# Patient Record
Sex: Female | Born: 1965 | Race: White | Hispanic: No | Smoking: Never smoker
Health system: Southern US, Community
[De-identification: ages and names within clinical notes are randomized; demographics above are authoritative.]

## PROBLEM LIST (undated history)

## (undated) DIAGNOSIS — I10 Essential (primary) hypertension: Secondary | ICD-10-CM

## (undated) DIAGNOSIS — E785 Hyperlipidemia, unspecified: Secondary | ICD-10-CM

## (undated) DIAGNOSIS — K9041 Non-celiac gluten sensitivity: Secondary | ICD-10-CM

## (undated) DIAGNOSIS — U071 COVID-19: Secondary | ICD-10-CM

## (undated) DIAGNOSIS — F419 Anxiety disorder, unspecified: Secondary | ICD-10-CM

## (undated) HISTORY — PX: OTHER SURGICAL HISTORY: SHX169

## (undated) HISTORY — DX: Essential (primary) hypertension: I10

## (undated) HISTORY — DX: COVID-19: U07.1

## (undated) HISTORY — PX: ADENOIDECTOMY W/ MYRINGOTOMY: SHX1128

## (undated) HISTORY — PX: ENDOMETRIAL ABLATION: SHX621

## (undated) HISTORY — PX: WRIST SURGERY: SHX841

## (undated) HISTORY — DX: Non-celiac gluten sensitivity: K90.41

---

## 1995-02-23 HISTORY — PX: TUBAL LIGATION: SHX77

## 2004-02-23 HISTORY — PX: WRIST SURGERY: SHX841

## 2004-10-21 ENCOUNTER — Ambulatory Visit: Payer: Self-pay | Admitting: Unknown Physician Specialty

## 2013-11-01 DIAGNOSIS — E785 Hyperlipidemia, unspecified: Secondary | ICD-10-CM | POA: Insufficient documentation

## 2013-11-01 DIAGNOSIS — R030 Elevated blood-pressure reading, without diagnosis of hypertension: Secondary | ICD-10-CM | POA: Insufficient documentation

## 2013-11-01 DIAGNOSIS — R739 Hyperglycemia, unspecified: Secondary | ICD-10-CM | POA: Insufficient documentation

## 2015-02-25 DIAGNOSIS — F32A Depression, unspecified: Secondary | ICD-10-CM | POA: Insufficient documentation

## 2015-02-25 DIAGNOSIS — F329 Major depressive disorder, single episode, unspecified: Secondary | ICD-10-CM | POA: Insufficient documentation

## 2015-02-25 DIAGNOSIS — F419 Anxiety disorder, unspecified: Secondary | ICD-10-CM

## 2015-12-25 ENCOUNTER — Emergency Department
Admission: EM | Admit: 2015-12-25 | Discharge: 2015-12-25 | Disposition: A | Discharge status: Home / Self Care | Payer: Managed Care, Other (non HMO) | Attending: Emergency Medicine | Admitting: Emergency Medicine

## 2015-12-25 ENCOUNTER — Emergency Department: Payer: Managed Care, Other (non HMO)

## 2015-12-25 ENCOUNTER — Encounter: Payer: Self-pay | Admitting: Emergency Medicine

## 2015-12-25 DIAGNOSIS — W1809XA Striking against other object with subsequent fall, initial encounter: Secondary | ICD-10-CM | POA: Diagnosis not present

## 2015-12-25 DIAGNOSIS — Z23 Encounter for immunization: Secondary | ICD-10-CM | POA: Diagnosis not present

## 2015-12-25 DIAGNOSIS — S0990XA Unspecified injury of head, initial encounter: Secondary | ICD-10-CM

## 2015-12-25 DIAGNOSIS — S0181XA Laceration without foreign body of other part of head, initial encounter: Secondary | ICD-10-CM

## 2015-12-25 DIAGNOSIS — Y9302 Activity, running: Secondary | ICD-10-CM | POA: Diagnosis not present

## 2015-12-25 DIAGNOSIS — S61212A Laceration without foreign body of right middle finger without damage to nail, initial encounter: Secondary | ICD-10-CM | POA: Diagnosis not present

## 2015-12-25 DIAGNOSIS — Y999 Unspecified external cause status: Secondary | ICD-10-CM | POA: Insufficient documentation

## 2015-12-25 DIAGNOSIS — Y929 Unspecified place or not applicable: Secondary | ICD-10-CM | POA: Diagnosis not present

## 2015-12-25 DIAGNOSIS — S80211A Abrasion, right knee, initial encounter: Secondary | ICD-10-CM | POA: Insufficient documentation

## 2015-12-25 MED ORDER — IBUPROFEN 800 MG PO TABS: 800.0000 mg | ORAL_TABLET | Freq: Three times a day (TID) | ORAL | 0 refills | Status: DC | PRN

## 2015-12-25 MED ORDER — IBUPROFEN 800 MG PO TABS
800.0000 mg | ORAL_TABLET | Freq: Once | ORAL | Status: AC
  Administered 2015-12-25: 800 mg via ORAL
  Filled 2015-12-25: qty 1

## 2015-12-25 MED ORDER — LIDOCAINE-EPINEPHRINE (PF) 1 %-1:200000 IJ SOLN
30.0000 mL | Freq: Once | INTRAMUSCULAR | Status: AC
  Administered 2015-12-25: 5 mL

## 2015-12-25 MED ORDER — CEPHALEXIN 500 MG PO CAPS: 500.0000 mg | ORAL_CAPSULE | Freq: Three times a day (TID) | ORAL | 0 refills | Status: AC

## 2015-12-25 MED ORDER — CEPHALEXIN 500 MG PO CAPS
ORAL_CAPSULE | ORAL | Status: AC
  Administered 2015-12-25: 500 mg via ORAL
  Filled 2015-12-25: qty 1

## 2015-12-25 MED ORDER — CEPHALEXIN 500 MG PO CAPS
500.0000 mg | ORAL_CAPSULE | Freq: Once | ORAL | Status: AC
  Administered 2015-12-25: 500 mg via ORAL

## 2015-12-25 MED ORDER — OXYCODONE-ACETAMINOPHEN 5-325 MG PO TABS: 1.0000 | ORAL_TABLET | Freq: Four times a day (QID) | ORAL | 0 refills | Status: DC | PRN

## 2015-12-25 MED ORDER — TETANUS-DIPHTH-ACELL PERTUSSIS 5-2.5-18.5 LF-MCG/0.5 IM SUSP
INTRAMUSCULAR | Status: AC
  Administered 2015-12-25: 0.5 mL via INTRAMUSCULAR
  Filled 2015-12-25: qty 0.5

## 2015-12-25 MED ORDER — LIDOCAINE-EPINEPHRINE (PF) 1 %-1:200000 IJ SOLN
INTRAMUSCULAR | Status: AC
  Administered 2015-12-25: 5 mL
  Filled 2015-12-25: qty 30

## 2015-12-25 MED ORDER — TETANUS-DIPHTH-ACELL PERTUSSIS 5-2.5-18.5 LF-MCG/0.5 IM SUSP
0.5000 mL | Freq: Once | INTRAMUSCULAR | Status: AC
  Administered 2015-12-25: 0.5 mL via INTRAMUSCULAR

## 2015-12-25 NOTE — ED Notes (Signed)
Pt returned from CT via stretcher.

## 2015-12-25 NOTE — ED Provider Notes (Signed)
[name] Emergency Department Provider Note  ____________________________________________  Time seen: Approximately 9:56 PM  I have reviewed the triage vital signs and the nursing notes.   HISTORY  Chief Complaint Laceration    HPI Anna Andersen is a 50 y.o. female who tripped while jogging after her dog and fell forward hitting her head on a concrete object. She believes she hit a corner base of a concrete post.She denies loss of consciousness. She has a headache and some dizziness. She has a laceration to the mid forehead as well as the right volar hand third digit. She has abrasions to her right anterior knee. She denies chest pain or shortness of breath. She denies injury to her left hand or arm. No back pain. Only minimal neck pain. No dental injury.   No past medical history on file.  There are no active problems to display for this patient.   No past surgical history on file.    Allergies Review of patient's allergies indicates no known allergies.  No family history on file.  Social History Social History  Substance Use Topics  . Smoking status: Not on file  . Smokeless tobacco: Not on file  . Alcohol use Not on file    Review of Systems Constitutional: No fever/chills Eyes: No visual changes. ENT: No sore throat. Cardiovascular: Denies chest pain. Respiratory: Denies shortness of breath. Gastrointestinal: No abdominal pain.  No nausea, no vomiting. Musculoskeletal: Negative for back pain. Skin: per HPI Neurological: Negative for headaches, focal weakness or numbness. 10-point ROS otherwise negative.  ____________________________________________   PHYSICAL EXAM:  VITAL SIGNS: ED Triage Vitals  Enc Vitals Group     BP 12/25/15 2040 102/71     Pulse Rate 12/25/15 2040 70     Resp 12/25/15 2040 18     Temp 12/25/15 2040 97.8 F (36.6 C)     Temp Source 12/25/15 2040 Oral     SpO2 12/25/15 2040 100 %     Weight  12/25/15 2041 135 lb (61.2 kg)     Height 12/25/15 2041 5\' 1"  (1.549 m)     Head Circumference --      Peak Flow --      Pain Score 12/25/15 2041 8     Pain Loc --      Pain Edu? --      Excl. in [place]? --     Constitutional: Alert and oriented. Well appearing and in no acute distress. Eyes: Conjunctivae are normal. PERRL. EOMI. Ears:  Clear with normal landmarks. No erythema. Head:Linear laceration to the mid forehead about 3 cm: Another laceration superiorly approximately 1 cm in length. Nose: No congestion/rhinnorhea. Mouth/Throat: Mucous membranes are moist.  Oropharynx non-erythematous. No lesions. Neck:  Supple.  No adenopathy.  PARAcervical spine tenderness to palpation. Cardiovascular: Normal rate, regular rhythm. Grossly normal heart sounds.  Good peripheral circulation. Respiratory: Normal respiratory effort.  No retractions. Lungs CTAB. Gastrointestinal: Soft and nontender. No distention. No abdominal bruits. No CVA tenderness. Musculoskeletal: Nml ROM of upper and lower extremity joints. Neurologic:  Normal speech and language. No gross focal neurologic deficits are appreciated. No gait instability. Skin:  Skin is warm, dry and intact. No rash noted. Except for a superficial flap laceration to the volar aspect of the right third digit 2      1 cm abrasions to the right anterior knee Psychiatric: Mood and affect are normal. Speech and behavior are normal.  ____________________________________________   LABS (all labs ordered are  listed, but only abnormal results are displayed)  Labs Reviewed - No data to display ____________________________________________  EKG   ____________________________________________  RADIOLOGY  CLINICAL DATA:  Tripped and hit corner of concrete post with head. Forehead laceration. Initial encounter.  EXAM: CT HEAD WITHOUT CONTRAST  TECHNIQUE: Contiguous axial images were obtained from the base of the skull through the vertex without  intravenous contrast.  COMPARISON:  None.  FINDINGS: Brain: No evidence of acute infarction, hemorrhage, hydrocephalus, extra-axial collection or mass lesion/mass effect.  The posterior fossa, including the cerebellum, brainstem and fourth ventricle, is within normal limits. The third and lateral ventricles, and basal ganglia are unremarkable in appearance. The cerebral hemispheres are symmetric in appearance, with normal gray-white differentiation. No mass effect or midline shift is seen.  Vascular: No hyperdense vessel or unexpected calcification.  Skull: There is no evidence of fracture; visualized osseous structures are unremarkable in appearance.  Sinuses/Orbits: The visualized portions of the orbits are within normal limits. The paranasal sinuses and mastoid air cells are well-aerated.  Other: Soft tissue swelling is noted overlying the left frontal calvarium, with associated soft tissue laceration. This extends to the nose.  IMPRESSION: 1. No evidence of traumatic intracranial injury or fracture. 2. Soft tissue swelling overlying the left frontal calvarium, with associated soft tissue laceration.   Electronically Signed   By: Garald Balding M.D.   On: 12/25/2015 21:58  ____________________________________________   PROCEDURES  Procedure(s) performed: LACERATION REPAIR Performed by: Mortimer Fries Authorized by: Mortimer Fries Consent: Verbal consent obtained. Risks and benefits: risks, benefits and alternatives were discussed Consent given by: patient Patient identity confirmed: provided demographic data Prepped and Draped in normal sterile fashion Wound explored  Laceration Location: Forehead  Laceration Length: 3 cm  No Foreign Bodies seen or palpated  Anesthesia: local infiltration  Local anesthetic: lidocaine 1 % with epinephrine  Anesthetic total: 7 ml  Irrigation method: syringe Amount of cleaning: standard  Skin closure: Nylon    Number of sutures: 8   Technique: Simple interrupted   Patient tolerance: Patient tolerated the procedure well with no immediate complications.    LACERATION REPAIR Performed by: Mortimer Fries Authorized by: Mortimer Fries Consent: Verbal consent obtained. Risks and benefits: risks, benefits and alternatives were discussed Consent given by: patient Patient identity confirmed: provided demographic data Prepped and Draped in normal sterile fashion Wound explored  Laceration Location: Superior forehead  Laceration Length: 1 cm  No Foreign Bodies seen or palpated  Anesthesia: NA  Local anesthetic: na  Anesthetic total: na  Irrigation method: syringe Amount of cleaning: standard  Skin closure: Steri-Strips   Number of sutures:   Technique:   Patient tolerance: Patient tolerated the procedure well with no immediate complications.   LACERATION REPAIR Performed by: Mortimer Fries Authorized by: Mortimer Fries Consent: Verbal consent obtained. Risks and benefits: risks, benefits and alternatives were discussed Consent given by: patient Patient identity confirmed: provided demographic data Prepped and Draped in normal sterile fashion Wound explored  Laceration Location: Right hand third digit volar aspect  Laceration Length: 1 cm  No Foreign Bodies seen or palpated  Anesthesia: N/A  Local anesthetic: NA  Anesthetic total:  Irrigation method: syringe Amount of cleaning: standard  Skin closure: Wrapped in Vaseline gauze   Number of sutures: N/A  Technique:   Patient tolerance: Patient tolerated the procedure well with no immediate complications.  Critical Care performed: No  ____________________________________________   INITIAL IMPRESSION / ASSESSMENT AND PLAN / ED COURSE  Pertinent labs & imaging results  that were available during my care of the patient were reviewed by me and considered in my medical decision making (see chart for  details).  50 year old who suffered a fall resulting in head trauma and right hand injury, as well as right knee abrasion. Normal CT of the head. Laceration care per procedures above. Tolerated well. Will follow-up in 7-10 days for suture removal. She is given tetanus injection as well as 4 days of antibiotics, Keflex. Instructed in wound care. She can return to emergency for any concerns. ____________________________________________   FINAL CLINICAL IMPRESSION(S) / ED DIAGNOSES  Final diagnoses:  Laceration of forehead, initial encounter  Laceration of right middle finger without foreign body without damage to nail, initial encounter  Injury of head, initial encounter      Mortimer Fries, PA-C 12/25/15 2327    Eula Listen, MD 12/25/15 2338

## 2015-12-25 NOTE — Discharge Instructions (Signed)
Keep forehead wound dry for 24 hours. Afterwards only slight exposure to water. Watch for signs of infection. Follow-up in 7-10 days for suture removal. Return to the emergency room for any concerns.

## 2015-12-25 NOTE — ED Notes (Signed)
Pt tripped, hit corner of a concrete post. Forehead lac, R knee abrasion, R middle finger swelling and bruising. Lac to underneath R middle finger. Chuck placed underneath. Bleeding controlled at this time. Denies LOC, denies blood thinner use. Alert and oriented x4.

## 2015-12-25 NOTE — ED Triage Notes (Signed)
Pt in with co laceration to forehead and bruising ro gith 3rd finger after tripping on dog and hitting cement post. Denies any loc, co soreness all over.

## 2015-12-30 ENCOUNTER — Ambulatory Visit: Payer: Self-pay | Admitting: Physician Assistant

## 2016-03-01 ENCOUNTER — Ambulatory Visit: Payer: Self-pay | Admitting: Physician Assistant

## 2016-03-01 VITALS — BP 131/89 | HR 93 | Temp 98.2°F

## 2016-03-01 DIAGNOSIS — J0100 Acute maxillary sinusitis, unspecified: Secondary | ICD-10-CM

## 2016-03-01 MED ORDER — AMOXICILLIN 875 MG PO TABS
875.0000 mg | ORAL_TABLET | Freq: Two times a day (BID) | ORAL | 0 refills | Status: DC
Start: 2016-03-01 — End: 2016-05-31

## 2016-03-01 NOTE — Progress Notes (Signed)
S: C/o sore throat and congestion for 1-2 days, no fever, chills, cp/sob, v/d; throat is sore like it is when she gets strep,  Using otc meds:   O: PE: vitals wnl nad, perrl eomi, normocephalic, tms dull, nasal mucosa red and swollen, throat injected, neck supple no lymph, lungs c t a, cv rrr, neuro intact  A:  Acute sinusitis   P: drink fluids, continue regular meds , use otc meds of choice, return if not improving in 5 days, return earlier if worsening , amoxil 875 bid x 10d

## 2016-05-31 ENCOUNTER — Ambulatory Visit: Payer: Self-pay | Admitting: Physician Assistant

## 2016-05-31 ENCOUNTER — Encounter: Payer: Self-pay | Admitting: Physician Assistant

## 2016-05-31 VITALS — BP 122/80 | HR 85 | Temp 98.3°F

## 2016-05-31 DIAGNOSIS — J0100 Acute maxillary sinusitis, unspecified: Secondary | ICD-10-CM

## 2016-05-31 DIAGNOSIS — J301 Allergic rhinitis due to pollen: Secondary | ICD-10-CM

## 2016-05-31 MED ORDER — FLUTICASONE PROPIONATE 50 MCG/ACT NA SUSP: 2.0000 | Freq: Every day | NASAL | 6 refills | Status: DC

## 2016-05-31 NOTE — Progress Notes (Addendum)
S: C/o runny nose and congestion for 3 days, no fever, chills, cp/sob, v/d; mucus is green and thick this am only, otherwise runny nose is clear, cough is sporadic, just really tired, has body aches but just started on lipitor, had a physical with her doctor in March, was put on amoxil for a sinus infection  Using otc meds:   O: PE: vitals wnl, nad, perrl eomi, normocephalic, tms dull, nasal mucosa red and swollen, throat injected, neck supple no lymph, lungs c t a, cv rrr, neuro intact  A:  Acute sinusitis, allergic rhinitis   P: drink fluids, continue regular meds , use otc meds of choice, return if not improving in 5 days, return earlier if worsening , flonase, otc claritin, otc co q 10 for aches assoc with lipitor, if worsenig this week will call in an antibiotic Pt called and is not better, ?whether she could get an antibiotic, sent rx for amoxil to [facility]

## 2016-06-02 MED ORDER — AMOXICILLIN 875 MG PO TABS
875.0000 mg | ORAL_TABLET | Freq: Two times a day (BID) | ORAL | 0 refills | Status: DC
Start: 2016-06-02 — End: 2017-09-26

## 2016-06-02 NOTE — Addendum Note (Signed)
Addended by: Versie Starks on: 06/02/2016 02:31 PM   Modules accepted: Orders

## 2016-10-27 ENCOUNTER — Other Ambulatory Visit: Payer: Self-pay

## 2016-10-27 DIAGNOSIS — Z299 Encounter for prophylactic measures, unspecified: Secondary | ICD-10-CM

## 2016-10-27 NOTE — Progress Notes (Signed)
Patient came in to have blood drawn for testing per Dr. Fulton Reek orders.

## 2016-10-28 LAB — MICROSCOPIC EXAMINATION: Casts: NONE SEEN /lpf

## 2016-10-28 LAB — CMP12+LP+TP+TSH+6AC+CBC/D/PLT
ALK PHOS: 43 IU/L (ref 39–117)
ALT: 16 IU/L (ref 0–32)
AST: 19 IU/L (ref 0–40)
Albumin/Globulin Ratio: 1.7 (ref 1.2–2.2)
Albumin: 4.5 g/dL (ref 3.5–5.5)
BASOS: 0 %
BUN / CREAT RATIO: 17 (ref 9–23)
BUN: 14 mg/dL (ref 6–24)
Basophils Absolute: 0 10*3/uL (ref 0.0–0.2)
Bilirubin Total: 0.2 mg/dL (ref 0.0–1.2)
CALCIUM: 9.6 mg/dL (ref 8.7–10.2)
CHOL/HDL RATIO: 4.8 ratio — AB (ref 0.0–4.4)
Chloride: 107 mmol/L — ABNORMAL HIGH (ref 96–106)
Cholesterol, Total: 240 mg/dL — ABNORMAL HIGH (ref 100–199)
Creatinine, Ser: 0.83 mg/dL (ref 0.57–1.00)
EOS (ABSOLUTE): 0.1 10*3/uL (ref 0.0–0.4)
EOS: 2 %
Estimated CHD Risk: 1.2 times avg. — ABNORMAL HIGH (ref 0.0–1.0)
Free Thyroxine Index: 2.4 (ref 1.2–4.9)
GFR calc non Af Amer: 83 mL/min/{1.73_m2} (ref >59)
GFR, EST AFRICAN AMERICAN: 96 mL/min/{1.73_m2} (ref >59)
GGT: 22 IU/L (ref 0–60)
Globulin, Total: 2.7 g/dL (ref 1.5–4.5)
Glucose: 98 mg/dL (ref 65–99)
HDL: 50 mg/dL (ref >39)
HEMATOCRIT: 39.1 % (ref 34.0–46.6)
Hemoglobin: 13.1 g/dL (ref 11.1–15.9)
IMMATURE GRANS (ABS): 0 10*3/uL (ref 0.0–0.1)
Immature Granulocytes: 0 %
Iron: 57 ug/dL (ref 27–159)
LDH: 158 IU/L (ref 119–226)
LDL CALC: 177 mg/dL — AB (ref 0–99)
LYMPHS: 39 %
Lymphocytes Absolute: 2.6 10*3/uL (ref 0.7–3.1)
MCH: 30 pg (ref 26.6–33.0)
MCHC: 33.5 g/dL (ref 31.5–35.7)
MCV: 90 fL (ref 79–97)
MONOS ABS: 0.5 10*3/uL (ref 0.1–0.9)
Monocytes: 8 %
NEUTROS ABS: 3.5 10*3/uL (ref 1.4–7.0)
Neutrophils: 51 %
POTASSIUM: 4.7 mmol/L (ref 3.5–5.2)
Phosphorus: 3.1 mg/dL (ref 2.5–4.5)
Platelets: 397 10*3/uL — ABNORMAL HIGH (ref 150–379)
RBC: 4.36 x10E6/uL (ref 3.77–5.28)
RDW: 12.9 % (ref 12.3–15.4)
SODIUM: 141 mmol/L (ref 134–144)
T3 Uptake Ratio: 29 % (ref 24–39)
T4, Total: 8.3 ug/dL (ref 4.5–12.0)
TRIGLYCERIDES: 66 mg/dL (ref 0–149)
TSH: 2.95 u[IU]/mL (ref 0.450–4.500)
Total Protein: 7.2 g/dL (ref 6.0–8.5)
Uric Acid: 3.8 mg/dL (ref 2.5–7.1)
VLDL Cholesterol Cal: 13 mg/dL (ref 5–40)
WBC: 6.7 10*3/uL (ref 3.4–10.8)

## 2016-10-28 LAB — URINALYSIS, ROUTINE W REFLEX MICROSCOPIC
BILIRUBIN UA: NEGATIVE
Glucose, UA: NEGATIVE
KETONES UA: NEGATIVE
Leukocytes, UA: NEGATIVE
NITRITE UA: NEGATIVE
Protein, UA: NEGATIVE
SPEC GRAV UA: 1.021 (ref 1.005–1.030)
Urobilinogen, Ur: 0.2 mg/dL (ref 0.2–1.0)
pH, UA: 5 (ref 5.0–7.5)

## 2016-10-28 LAB — HGB A1C W/O EAG: Hgb A1c MFr Bld: 5.4 % (ref 4.8–5.6)

## 2017-02-08 ENCOUNTER — Other Ambulatory Visit: Payer: Self-pay

## 2017-02-08 DIAGNOSIS — L509 Urticaria, unspecified: Secondary | ICD-10-CM

## 2017-02-08 NOTE — Progress Notes (Signed)
Patient came in to have blood drawn for testing per Dr. Dellis Filbert Spark's orders.

## 2017-02-11 LAB — ALLERGEN PROFILE, MINI-PANEL
Alternaria Alternata IgE: <0.1 kU/L
Bermuda Grass IgE: <0.1 kU/L
Elm, American IgE: <0.1 kU/L
Kentucky Bluegrass IgE: <0.1 kU/L
Oak, White IgE: <0.1 kU/L
Ragweed, Short IgE: <0.1 kU/L

## 2017-02-11 LAB — ALLERGEN PROFILE, BASIC FOOD
Chocolate/Cacao IgE: <0.1 kU/L
Egg, Whole IgE: <0.1 kU/L
Food Mix (Seafoods) IgE: NEGATIVE
Milk IgE: <0.1 kU/L

## 2017-02-11 LAB — ANTIGLIADIN IGG (NATIVE): ANTIGLIADIN IGG (NATIVE): 7 U (ref 0–19)

## 2017-02-11 LAB — GLUTEN SENSITIVITY SCREEN: tTG/DGP Screen: NEGATIVE

## 2017-02-11 LAB — NOTE:

## 2017-02-11 LAB — F004-IGE WHEAT

## 2017-02-22 HISTORY — PX: COLONOSCOPY: SHX174

## 2017-09-26 ENCOUNTER — Ambulatory Visit: Payer: Self-pay | Admitting: Family Medicine

## 2017-09-26 VITALS — BP 141/89 | HR 96 | Temp 98.3°F | Resp 20

## 2017-09-26 DIAGNOSIS — N3000 Acute cystitis without hematuria: Secondary | ICD-10-CM

## 2017-09-26 DIAGNOSIS — R300 Dysuria: Secondary | ICD-10-CM

## 2017-09-26 LAB — POCT URINALYSIS DIPSTICK
BILIRUBIN UA: NEGATIVE
GLUCOSE UA: NEGATIVE
KETONES UA: NEGATIVE
Nitrite, UA: NEGATIVE
PH UA: 6.5 (ref 5.0–8.0)
Protein, UA: NEGATIVE
SPEC GRAV UA: 1.01 (ref 1.010–1.025)
Urobilinogen, UA: 0.2 E.U./dL

## 2017-09-26 MED ORDER — SULFAMETHOXAZOLE-TRIMETHOPRIM 800-160 MG PO TABS: 1.0000 | ORAL_TABLET | Freq: Two times a day (BID) | ORAL | 0 refills | Status: AC

## 2017-09-26 NOTE — Progress Notes (Signed)
Subjective: Dysuria    Anna Andersen is a 52 y.o. female who complains of burning with urination, urinary frequency and suprapubic pressure for 1 day. Patient denies any other complaints at all, including flank pain, congestion, cough, fever, chills, malaise, headache, rhinitis, sorethroat, abdominal pain, nausea, vomiting, diarrhea, hematuria, concern for STI, or vaginal discharge.  Patient does not have a history of recurrent UTI.  Patient believes she has a history of pyelonephritis in the remote past over 2 years ago but reports only being treated with a 3-day course of Bactrim, with complete resolution of symptoms.  Denies sequela.  Denies any other relevant history.   Review of Systems Pertinent items noted in HPI and remainder of comprehensive ROS otherwise negative.    Objective:   General: alert, cooperative, appears stated age and no distress  Abdomen:  Mild suprapubic "pressure" upon palpation.  Otherwise abdomen is soft, non-tender, without masses or organomegaly   Back: CVA tenderness absent  GU: defer exam-unable to perform in clinic.   Laboratory:  Urine dipstick: negative for all components except a small amount of blood present and moderate leukocytes present.   Assessment:    Acute cystitis    Plan: Plan:   Medications: TMP/SMX.  Patient has taken this in the past and tolerated it well.  Discussed side/adverse effects. Maintain adequate hydration Follow up with PCP in 4 days and as needed. Red flag symptoms and indications to seek medical care discussed.

## 2019-07-12 ENCOUNTER — Other Ambulatory Visit: Payer: Self-pay | Admitting: Internal Medicine

## 2019-07-12 DIAGNOSIS — Z1231 Encounter for screening mammogram for malignant neoplasm of breast: Secondary | ICD-10-CM

## 2019-07-26 ENCOUNTER — Telehealth: Payer: Self-pay | Admitting: Certified Nurse Midwife

## 2019-07-26 NOTE — Telephone Encounter (Signed)
Pt called in and needs to move her appt. She stated that she having chest pains. The pt stated that she is going to the ED and that she is moving the appt, due to going there. The pt also stated she went to her PCP and he has her on blood pressure issues. I told the pt I was going to take a message and move her appt. The pt verbally understood.

## 2019-07-27 ENCOUNTER — Encounter: Payer: Self-pay | Admitting: Certified Nurse Midwife

## 2019-07-30 ENCOUNTER — Other Ambulatory Visit
Admission: RE | Admit: 2019-07-30 | Discharge: 2019-07-30 | Disposition: A | Discharge status: Home / Self Care | Payer: Managed Care, Other (non HMO) | Source: Ambulatory Visit | Attending: *Deleted | Admitting: *Deleted

## 2019-07-31 LAB — TROPONIN T: Troponin T (Highly Sensitive): <6 ng/L (ref 0–14)

## 2019-08-31 ENCOUNTER — Other Ambulatory Visit: Payer: Self-pay

## 2019-08-31 ENCOUNTER — Encounter: Payer: Self-pay | Admitting: Certified Nurse Midwife

## 2019-08-31 ENCOUNTER — Ambulatory Visit (INDEPENDENT_AMBULATORY_CARE_PROVIDER_SITE_OTHER): Payer: Managed Care, Other (non HMO) | Admitting: Certified Nurse Midwife

## 2019-08-31 VITALS — BP 127/90 | HR 74 | Ht 61.0 in | Wt 130.4 lb

## 2019-08-31 DIAGNOSIS — N921 Excessive and frequent menstruation with irregular cycle: Secondary | ICD-10-CM

## 2019-08-31 DIAGNOSIS — Z9889 Other specified postprocedural states: Secondary | ICD-10-CM | POA: Diagnosis not present

## 2019-08-31 DIAGNOSIS — Z8616 Personal history of COVID-19: Secondary | ICD-10-CM

## 2019-08-31 DIAGNOSIS — Z01419 Encounter for gynecological examination (general) (routine) without abnormal findings: Secondary | ICD-10-CM | POA: Diagnosis not present

## 2019-08-31 NOTE — Progress Notes (Signed)
ANNUAL PREVENTATIVE CARE GYN  ENCOUNTER NOTE  Subjective:       Anna Andersen is a 54 y.o. [id number] female here for a routine annual gynecologic exam.  Current complaints: 1. Irregular, heavy menses since February  Denies difficulty breathing or respiratory distress, chest pain, abdominal pain, dysuria, and leg pain or swelling.   History of ablation, COVID-19 (February 2020) and gluten allergy.   PCP: [facility]   Gynecologic History  Patient's last menstrual period was 07/16/2019 (exact date).  Contraception: none  Last Pap: 08/2016. Results were: Negative/Negative  Last mammogram: never, ordered.   Last colonoscopy: 2019. Results were: Negative  Obstetric History  OB History  Gravida Para Term Preterm AB Living  3 3 3     3   SAB TAB Ectopic Multiple Live Births          3    # Outcome Date GA Lbr Len/2nd Weight Sex Delivery Anes PTL Lv  3 Term 11/29/95   7 lb 15 oz (3.6 kg) M CS-Unspec  N LIV  2 Term 01/16/91   8 lb 7.5 oz (3.841 kg) M CS-Unspec  N LIV  1 Term 08/02/87   8 lb 7 oz (3.827 kg) F CS-Unspec  N LIV    Past Medical History:  Diagnosis Date   COVID-19    Hypertension     Current Outpatient Medications on File Prior to Visit  Medication Sig Dispense Refill   propranolol (INDERAL) 20 MG tablet Take 20 mg by mouth 2 (two) times daily.     RHODIOLA ROSEA PO Take by mouth.     No current facility-administered medications on file prior to visit.    Allergies  Allergen Reactions   Gluten Meal Itching and Rash    Social History   Socioeconomic History   Marital status: Divorced    Spouse name: Not on file   Number of children: Not on file   Years of education: Not on file   Highest education level: Not on file  Occupational History   Not on file  Tobacco Use   Smoking status: Never Smoker   Smokeless tobacco: Never Used  Substance and Sexual Activity   Alcohol use: Never   Drug use: Never   Sexual activity: Yes    Birth  control/protection: None  Other Topics Concern   Not on file  Social History Narrative   Not on file   Social Determinants of Health   Financial Resource Strain:    Difficulty of Paying Living Expenses:   Food Insecurity:    Worried About Charity fundraiser in the Last Year:    Arboriculturist in the Last Year:   Transportation Needs:    Film/video editor (Medical):    Lack of Transportation (Non-Medical):   Physical Activity:    Days of Exercise per Week:    Minutes of Exercise per Session:   Stress:    Feeling of Stress :   Social Connections:    Frequency of Communication with Friends and Family:    Frequency of Social Gatherings with Friends and Family:    Attends Religious Services:    Active Member of Clubs or Organizations:    Attends Archivist Meetings:    Marital Status:   Intimate Partner Violence:    Fear of Current or Ex-Partner:    Emotionally Abused:    Physically Abused:    Sexually Abused:     The following portions of the patient's history  were reviewed and updated as appropriate: allergies, current medications, past family history, past medical history, past social history, past surgical history and problem list.  Review of Systems  ROS negative except as noted above. Information obtained from patient.    Objective:   BP 127/90    Pulse 74    Ht 5\' 1"  (1.549 m)    Wt 130 lb 6 oz (59.1 kg)    LMP 07/16/2019 (Exact Date)    BMI 24.63 kg/m    CONSTITUTIONAL: Well-developed, well-nourished female in no acute distress.   PSYCHIATRIC: Normal mood and affect. Normal behavior. Normal judgment and thought content.  [place]: Alert and oriented to person, place, and time. Normal muscle tone coordination. No cranial nerve deficit noted.  HENT:  Normocephalic, atraumatic, External right and left ear normal.   EYES: Conjunctivae and EOM are normal. Pupils are equal and round.   NECK: Normal range of motion, supple, no  masses.  Normal thyroid.   SKIN: Skin is warm and dry. Diffuse gluten reaction rash present. Not diaphoretic. No erythema. No pallor.  CARDIOVASCULAR: Normal heart rate noted, regular rhythm, no murmur.  RESPIRATORY: Clear to auscultation bilaterally. Effort and breath sounds normal, no problems with respiration noted.  BREASTS: Symmetric in size. No masses, skin changes, nipple drainage, or lymphadenopathy.  ABDOMEN: Soft, normal bowel sounds, no distention noted.  No tenderness, rebound or guarding.   PELVIC:  External Genitalia: Normal  Vagina: Normal  Cervix: Normal  Uterus: Normal  Adnexa: Normal  RV: External Exam NormaI    MUSCULOSKELETAL: Normal range of motion. No tenderness.  No cyanosis, clubbing, or edema.  2+ distal pulses.  LYMPHATIC: No Axillary, Supraclavicular, or Inguinal Adenopathy.  Assessment:   Annual gynecologic examination 54 y.o.   Contraception: none   Normal BMI   Problem List Items Addressed This Visit    None    Visit Diagnoses    Well woman exam    -  Primary   Relevant Orders   FSH/LH   Progesterone   Estradiol   Menorrhagia with irregular cycle       Relevant Orders   FSH/LH   Progesterone   Estradiol   History of endometrial ablation       Relevant Orders   FSH/LH   Progesterone   Estradiol   History of 2019 novel coronavirus disease (COVID-19)          Plan:   Pap: Not needed   Mammogram: Ordered by [employer]: See orders  Routine preventative health maintenance measures emphasized: Exercise/Diet/Weight control, Tobacco Warnings, Alcohol/Substance use risks and Stress Management; see AVS  Reviewed red flag symptoms and when to call  Return to [place] for [employer] or sooner if needed   Dani Gobble, CNM  [facility], [facility] 08/31/19 10:29 AM

## 2019-08-31 NOTE — Patient Instructions (Signed)
Perimenopause  Perimenopause is the normal time of life before and after menstrual periods stop completely (menopause). Perimenopause can begin 2-8 years before menopause, and it usually lasts for 1 year after menopause. During perimenopause, the ovaries may or may not produce an egg. What are the causes? This condition is caused by a natural change in hormone levels that happens as you get older. What increases the risk? This condition is more likely to start at an earlier age if you have certain medical conditions or treatments, including:  A tumor of the pituitary gland in the brain.  A disease that affects the ovaries and hormone production.  Radiation treatment for cancer.  Certain cancer treatments, such as chemotherapy or hormone (anti-estrogen) therapy.  Heavy smoking and excessive alcohol use.  Family history of early menopause. What are the signs or symptoms? Perimenopausal changes affect each woman differently. Symptoms of this condition may include:  Hot flashes.  Night sweats.  Irregular menstrual periods.  Decreased sex drive.  Vaginal dryness.  Headaches.  Mood swings.  Depression.  Memory problems or trouble concentrating.  Irritability.  Tiredness.  Weight gain.  Anxiety.  Trouble getting pregnant. How is this diagnosed? This condition is diagnosed based on your medical history, a physical exam, your age, your menstrual history, and your symptoms. Hormone tests may also be done. How is this treated? In some cases, no treatment is needed. You and your health care provider should make a decision together about whether treatment is necessary. Treatment will be based on your individual condition and preferences. Various treatments are available, such as:  Menopausal hormone therapy (MHT).  Medicines to treat specific symptoms.  Acupuncture.  Vitamin or herbal supplements. Before starting treatment, make sure to let your health care provider  know if you have a personal or family history of:  Heart disease.  Breast cancer.  Blood clots.  Diabetes.  Osteoporosis. Follow these instructions at home: Lifestyle  Do not use any products that contain nicotine or tobacco, such as cigarettes and e-cigarettes. If you need help quitting, ask your health care provider.  Eat a balanced diet that includes fresh fruits and vegetables, whole grains, soybeans, eggs, lean meat, and low-fat dairy.  Get at least 30 minutes of physical activity on 5 or more days each week.  Avoid alcoholic and caffeinated beverages, as well as spicy foods. This may help prevent hot flashes.  Get 7-8 hours of sleep each night.  Dress in layers that can be removed to help you manage hot flashes.  Find ways to manage stress, such as deep breathing, meditation, or journaling. General instructions  Keep track of your menstrual periods, including: ? When they occur. ? How heavy they are and how long they last. ? How much time passes between periods.  Keep track of your symptoms, noting when they start, how often you have them, and how long they last.  Take over-the-counter and prescription medicines only as told by your health care provider.  Take vitamin supplements only as told by your health care provider. These may include calcium, vitamin E, and vitamin D.  Use vaginal lubricants or moisturizers to help with vaginal dryness and improve comfort during sex.  Talk with your health care provider before starting any herbal supplements.  Keep all follow-up visits as told by your health care provider. This is important. This includes any group therapy or counseling. Contact a health care provider if:  You have heavy vaginal bleeding or pass blood clots.  Your period  lasts more than 2 days longer than normal.  Your periods are recurring sooner than 21 days.  You bleed after having sex. Get help right away if:  You have chest pain, trouble  breathing, or trouble talking.  You have severe depression.  You have pain when you urinate.  You have severe headaches.  You have vision problems. Summary  Perimenopause is the time when a woman's body begins to move into menopause. This may happen naturally or as a result of other health problems or medical treatments.  Perimenopause can begin 2-8 years before menopause, and it usually lasts for 1 year after menopause.  Perimenopausal symptoms can be managed through medicines, lifestyle changes, and complementary therapies such as acupuncture. This information is not intended to replace advice given to you by your health care provider. Make sure you discuss any questions you have with your health care provider. Document Revised: 01/21/2017 Document Reviewed: 03/16/2016 Elsevier Patient Education  2020 Elsevier Inc.   Preventive Care 53-64 Years Old, Female Preventive care refers to visits with your health care provider and lifestyle choices that can promote health and wellness. This includes:  A yearly physical exam. This may also be called an annual well check.  Regular dental visits and eye exams.  Immunizations.  Screening for certain conditions.  Healthy lifestyle choices, such as eating a healthy diet, getting regular exercise, not using drugs or products that contain nicotine and tobacco, and limiting alcohol use. What can I expect for my preventive care visit? Physical exam Your health care provider will check your:  Height and weight. This may be used to calculate body mass index (BMI), which tells if you are at a healthy weight.  Heart rate and blood pressure.  Skin for abnormal spots. Counseling Your health care provider may ask you questions about your:  Alcohol, tobacco, and drug use.  Emotional well-being.  Home and relationship well-being.  Sexual activity.  Eating habits.  Work and work environment.  Method of birth control.  Menstrual  cycle.  Pregnancy history. What immunizations do I need?  Influenza (flu) vaccine  This is recommended every year. Tetanus, diphtheria, and pertussis (Tdap) vaccine  You may need a Td booster every 10 years. Varicella (chickenpox) vaccine  You may need this if you have not been vaccinated. Zoster (shingles) vaccine  You may need this after age 60. Measles, mumps, and rubella (MMR) vaccine  You may need at least one dose of MMR if you were born in 1957 or later. You may also need a second dose. Pneumococcal conjugate (PCV13) vaccine  You may need this if you have certain conditions and were not previously vaccinated. Pneumococcal polysaccharide (PPSV23) vaccine  You may need one or two doses if you smoke cigarettes or if you have certain conditions. Meningococcal conjugate (MenACWY) vaccine  You may need this if you have certain conditions. Hepatitis A vaccine  You may need this if you have certain conditions or if you travel or work in places where you may be exposed to hepatitis A. Hepatitis B vaccine  You may need this if you have certain conditions or if you travel or work in places where you may be exposed to hepatitis B. Haemophilus influenzae type b (Hib) vaccine  You may need this if you have certain conditions. Human papillomavirus (HPV) vaccine  If recommended by your health care provider, you may need three doses over 6 months. You may receive vaccines as individual doses or as more than one vaccine together   in one shot (combination vaccines). Talk with your health care provider about the risks and benefits of combination vaccines. What tests do I need? Blood tests  Lipid and cholesterol levels. These may be checked every 5 years, or more frequently if you are over 50 years old.  Hepatitis C test.  Hepatitis B test. Screening  Lung cancer screening. You may have this screening every year starting at age 55 if you have a 30-pack-year history of smoking and  currently smoke or have quit within the past 15 years.  Colorectal cancer screening. All adults should have this screening starting at age 50 and continuing until age 75. Your health care provider may recommend screening at age 45 if you are at increased risk. You will have tests every 1-10 years, depending on your results and the type of screening test.  Diabetes screening. This is done by checking your blood sugar (glucose) after you have not eaten for a while (fasting). You may have this done every 1-3 years.  Mammogram. This may be done every 1-2 years. Talk with your health care provider about when you should start having regular mammograms. This may depend on whether you have a family history of breast cancer.  BRCA-related cancer screening. This may be done if you have a family history of breast, ovarian, tubal, or peritoneal cancers.  Pelvic exam and Pap test. This may be done every 3 years starting at age 21. Starting at age 30, this may be done every 5 years if you have a Pap test in combination with an HPV test. Other tests  Sexually transmitted disease (STD) testing.  Bone density scan. This is done to screen for osteoporosis. You may have this scan if you are at high risk for osteoporosis. Follow these instructions at home: Eating and drinking  Eat a diet that includes fresh fruits and vegetables, whole grains, lean protein, and low-fat dairy.  Take vitamin and mineral supplements as recommended by your health care provider.  Do not drink alcohol if: ? Your health care provider tells you not to drink. ? You are pregnant, may be pregnant, or are planning to become pregnant.  If you drink alcohol: ? Limit how much you have to 0-1 drink a day. ? Be aware of how much alcohol is in your drink. In the [place] one drink equals one 12 oz bottle of beer (355 mL), one 5 oz glass of wine (148 mL), or one 1 oz glass of hard liquor (44 mL). Lifestyle  Take daily care of your teeth and  gums.  Stay active. Exercise for at least 30 minutes on 5 or more days each week.  Do not use any products that contain nicotine or tobacco, such as cigarettes, e-cigarettes, and chewing tobacco. If you need help quitting, ask your health care provider.  If you are sexually active, practice safe sex. Use a condom or other form of birth control (contraception) in order to prevent pregnancy and STIs (sexually transmitted infections).  If told by your health care provider, take low-dose aspirin daily starting at age 50. What's next?  Visit your health care provider once a year for a well check visit.  Ask your health care provider how often you should have your eyes and teeth checked.  Stay up to date on all vaccines. This information is not intended to replace advice given to you by your health care provider. Make sure you discuss any questions you have with your health care provider. Document Revised: 10/20/2017   Document Reviewed: 10/20/2017 Elsevier Patient Education  2020 Elsevier Inc.  

## 2019-09-01 LAB — FSH/LH
FSH: 58.3 m[IU]/mL
LH: 29.3 m[IU]/mL

## 2019-09-01 LAB — ESTRADIOL: Estradiol: 11.8 pg/mL

## 2019-09-01 LAB — PROGESTERONE: Progesterone: 0.2 ng/mL

## 2019-09-11 ENCOUNTER — Other Ambulatory Visit: Payer: Self-pay | Admitting: Certified Nurse Midwife

## 2019-09-11 DIAGNOSIS — N939 Abnormal uterine and vaginal bleeding, unspecified: Secondary | ICD-10-CM

## 2019-09-11 DIAGNOSIS — N921 Excessive and frequent menstruation with irregular cycle: Secondary | ICD-10-CM

## 2019-09-11 DIAGNOSIS — Z9889 Other specified postprocedural states: Secondary | ICD-10-CM

## 2019-09-11 NOTE — Telephone Encounter (Signed)
Called pt couldn't leave a message to schedule appt. Will try again tomorrow.

## 2019-09-11 NOTE — Progress Notes (Signed)
Ultrasound orders placed, see chart.   Dani Gobble, CNM [facility], [facility] 09/11/19 11:25 AM

## 2019-09-11 NOTE — Telephone Encounter (Signed)
Please contact patient to schedule ultrasound and results review with me. Thanks, JML

## 2019-09-27 ENCOUNTER — Ambulatory Visit (INDEPENDENT_AMBULATORY_CARE_PROVIDER_SITE_OTHER): Payer: Managed Care, Other (non HMO) | Admitting: Certified Nurse Midwife

## 2019-09-27 ENCOUNTER — Encounter: Payer: Self-pay | Admitting: Certified Nurse Midwife

## 2019-09-27 ENCOUNTER — Ambulatory Visit (INDEPENDENT_AMBULATORY_CARE_PROVIDER_SITE_OTHER): Payer: Managed Care, Other (non HMO)

## 2019-09-27 VITALS — BP 110/79 | HR 73 | Ht 61.0 in | Wt 130.9 lb

## 2019-09-27 DIAGNOSIS — Z9889 Other specified postprocedural states: Secondary | ICD-10-CM | POA: Diagnosis not present

## 2019-09-27 DIAGNOSIS — D252 Subserosal leiomyoma of uterus: Secondary | ICD-10-CM | POA: Insufficient documentation

## 2019-09-27 DIAGNOSIS — N921 Excessive and frequent menstruation with irregular cycle: Secondary | ICD-10-CM

## 2019-09-27 DIAGNOSIS — N939 Abnormal uterine and vaginal bleeding, unspecified: Secondary | ICD-10-CM

## 2019-09-27 HISTORY — DX: Subserosal leiomyoma of uterus: D25.2

## 2019-09-27 NOTE — Progress Notes (Signed)
GYN ENCOUNTER NOTE  Subjective:       Anna Andersen is a 54 y.o. G30P3003 female here for ultrasound and lab review.   Seen in office for [facility] on 08/31/2019; for further details, please see note.   Denies difficulty breathing or respiratory distress, chest pain, abdominal pain, excessive vaginal bleeding, dysuria, and leg pain or swelling.    Gynecologic History  Patient's last menstrual period was 07/16/2019 (exact date).  Contraception: post menopausal status, history of ablation  Last Pap: 08/2016. Results were: Neg/Neg  Last mammogram: ordered.   Last colonoscopy: 2019. Results were: negative.   Obstetric History  OB History  Gravida Para Term Preterm AB Living  3 3 3     3   SAB TAB Ectopic Multiple Live Births          3    # Outcome Date GA Lbr Len/2nd Weight Sex Delivery Anes PTL Lv  3 Term 11/29/95   7 lb 15 oz (3.6 kg) M CS-Unspec  N LIV  2 Term 01/16/91   8 lb 7.5 oz (3.841 kg) M CS-Unspec  N LIV  1 Term 08/02/87   8 lb 7 oz (3.827 kg) F CS-Unspec  N LIV    Past Medical History:  Diagnosis Date  . COVID-19   . Gluten intolerance   . Hypertension     Current Outpatient Medications on File Prior to Visit  Medication Sig Dispense Refill  . dapsone 25 MG tablet Take by mouth as directed.    . propranolol (INDERAL) 20 MG tablet Take 20 mg by mouth 2 (two) times daily.    Marland Kitchen RHODIOLA ROSEA PO Take by mouth.     No current facility-administered medications on file prior to visit.    Allergies  Allergen Reactions  . Gluten Meal Itching and Rash    Social History   Socioeconomic History  . Marital status: Divorced    Spouse name: Not on file  . Number of children: Not on file  . Years of education: Not on file  . Highest education level: Not on file  Occupational History  . Not on file  Tobacco Use  . Smoking status: Never Smoker  . Smokeless tobacco: Never Used  Vaping Use  . Vaping Use: Never used  Substance and Sexual Activity  .  Alcohol use: Never  . Drug use: Never  . Sexual activity: Yes    Birth control/protection: None  Other Topics Concern  . Not on file  Social History Narrative  . Not on file   Social Determinants of Health   Financial Resource Strain:   . Difficulty of Paying Living Expenses:   Food Insecurity:   . Worried About Charity fundraiser in the Last Year:   . Arboriculturist in the Last Year:   Transportation Needs:   . Film/video editor (Medical):   Marland Kitchen Lack of Transportation (Non-Medical):   Physical Activity:   . Days of Exercise per Week:   . Minutes of Exercise per Session:   Stress:   . Feeling of Stress :   Social Connections:   . Frequency of Communication with Friends and Family:   . Frequency of Social Gatherings with Friends and Family:   . Attends Religious Services:   . Active Member of Clubs or Organizations:   . Attends Archivist Meetings:   Marland Kitchen Marital Status:   Intimate Partner Violence:   . Fear of Current or Ex-Partner:   .  Emotionally Abused:   Marland Kitchen Physically Abused:   . Sexually Abused:     Family History  Problem Relation Age of Onset  . Cancer Maternal Grandmother   . Cancer Paternal Grandmother   . Ovarian cancer Neg Hx   . Colon cancer Neg Hx   . Diabetes Neg Hx     The following portions of the patient's history were reviewed and updated as appropriate: allergies, current medications, past family history, past medical history, past social history, past surgical history and problem list.  Review of Systems  ROS negative except as noted above. Information obtained from patient.   Objective:   BP 110/79   Pulse 73   Ht 5\' 1"  (1.549 m)   Wt 130 lb 14.4 oz (59.4 kg)   LMP 07/16/2019 (Exact Date)   BMI 24.73 kg/m    CONSTITUTIONAL: Well-developed, well-nourished female in no acute distress.   PHYSICAL EXAM: Not indicated.   Recent Results (from the past 2160 hour(s))  Troponin T     Status: None   Collection Time: 07/30/19   4:00 PM  Result Value Ref Range   Troponin T (Highly Sensitive) <6 0 - 14 ng/L    Comment: (NOTE) In order to distinguish acute elevations of high sensitive Troponin from other clinical conditions, the Universal Definition of myocardial infarction stresses clinical assessment and the need for serial measurements to observe a rise and/or fall above the upper limit of the reference interval. Performed At: [facility] [place], [place] [id number] Rush Farmer MD KG:[id number]   FSH/LH     Status: None   Collection Time: 08/31/19 10:49 AM  Result Value Ref Range   LH 29.3 mIU/mL    Comment:                     Adult Female:                       Follicular phase      2.4 -  12.6                       Ovulation phase      14.0 -  95.6                       Luteal phase          1.0 -  11.4                       Postmenopausal        7.7 -  58.5    FSH 58.3 mIU/mL    Comment:                     Adult Female:                       Follicular phase      3.5 -  12.5                       Ovulation phase       4.7 -  21.5                       Luteal phase          1.7 -   7.7  Postmenopausal       25.8 - 134.8   Progesterone     Status: None   Collection Time: 08/31/19 10:49 AM  Result Value Ref Range   Progesterone 0.2 ng/mL    Comment:                      Follicular phase       0.1 -   0.9                      Luteal phase           1.8 -  23.9                      Ovulation phase        0.1 -  12.0                      Pregnant                         First trimester    11.0 -  44.3                         Second trimester   25.4 -  83.3                         Third trimester    58.7 - 214.0                      Postmenopausal         0.0 -   0.1   Estradiol     Status: None   Collection Time: 08/31/19 10:49 AM  Result Value Ref Range   Estradiol 11.8 pg/mL    Comment:                     Adult Female:                        Follicular phase   76.2 -   166.0                       Ovulation phase    85.8 -   498.0                       Luteal phase       43.8 -   211.0                       Postmenopausal     <6.0 -    54.7                     Pregnancy                       1st trimester     215.0 - >4300.0 Roche ECLIA methodology    ULTRASOUND REPORT  Location: [facility]  Date of Service: 09/27/2019   Indications:AUB   Findings:  The uterus is anteverted and measures 8.8 x 4.5 x 5.8 cm. Echo texture is heterogenous with evidence of focal masses. Within the uterus are multiple suspected fibroids measuring:  Fibroid 1:Lower anterior body SS 2.9 x 2.9 x 3.3 cm  The Endometrium measures 6 mm.  Right Ovary measures 2.1 x 1.2 x 1.4  cm. It is normal in appearance. Left Ovary measures 2.2 x 1.0 x 2.0 cm. It is normal in appearance. Survey of the adnexa demonstrates no adnexal masses. There is no free fluid in the cul de sac.  Impression: 1. Fibroid uterus as described above.  Recommendations: 1.Clinical correlation with the patient's History and Physical Exam.   Assessment:   1. Subserous leiomyoma of uterus  Plan:   Ultrasound findings and lab results reviewed with patient, verbalized understanding.   Discussed option for endometrial biopsy today, follow up ultrasound in six (6), or endometrial biopsy after another episode of bleeding.   Patient agrees to follow up in six (6) months and endometrial biopsy after additional bleeding episode.   Reviewed red flag symptoms and when to call.   RTC x 6 months for repeat ultrasound and labs or sooner if needed.    Dani Gobble, CNM [facility], [facility] 09/27/19 3:24 PM

## 2019-09-27 NOTE — Patient Instructions (Addendum)
Uterine Fibroids  Uterine fibroids (leiomyomas) are noncancerous (benign) tumors that can develop in the uterus. Fibroids may also develop in the fallopian tubes, cervix, or tissues (ligaments) near the uterus. You may have one or many fibroids. Fibroids vary in size, weight, and where they grow in the uterus. Some can become quite large. Most fibroids do not require medical treatment. What are the causes? The cause of this condition is not known. What increases the risk? You are more likely to develop this condition if you:  Are in your 30s or 40s and have not gone through menopause.  Have a family history of this condition.  Are of African-American descent.  Had your first period at an early age (early menarche).  Have not had any children (nulliparity).  Are overweight or obese. What are the signs or symptoms? Many women do not have any symptoms. Symptoms of this condition may include:  Heavy menstrual bleeding.  Bleeding or spotting between periods.  Pain and pressure in the pelvic area, between the hips.  Bladder problems, such as needing to urinate urgently or more often than usual.  Inability to have children (infertility).  Failure to carry pregnancy to term (miscarriage). How is this diagnosed? This condition may be diagnosed based on:  Your symptoms and medical history.  A physical exam.  A pelvic exam that includes feeling for any tumors.  Imaging tests, such as ultrasound or MRI. How is this treated? Treatment for this condition may include:  Seeing your health care provider for follow-up visits to monitor your fibroids for any changes.  Taking NSAIDs such as ibuprofen, naproxen, or aspirin to reduce pain.  Hormone medicines. These may be taken as a pill, given in an injection, or delivered by a T-shaped device that is inserted into the uterus (intrauterine device, IUD).  Surgery to remove one of the following: ? The fibroids (myomectomy). Your health  care provider may recommend this if fibroids affect your fertility and you want to become pregnant. ? The uterus (hysterectomy). ? Blood supply to the fibroids (uterine artery embolization). Follow these instructions at home:  Take over-the-counter and prescription medicines only as told by your health care provider.  Ask your health care provider if you should take iron pills or eat more iron-rich foods, such as dark green, leafy vegetables. Heavy menstrual bleeding can cause low iron levels.  If directed, apply heat to your back or abdomen to reduce pain. Use the heat source that your health care provider recommends, such as a moist heat pack or a heating pad. ? Place a towel between your skin and the heat source. ? Leave the heat on for 20-30 minutes. ? Remove the heat if your skin turns bright red. This is especially important if you are unable to feel pain, heat, or cold. You may have a greater risk of getting burned.  Pay close attention to your menstrual cycle. Tell your health care provider about any changes, such as: ? Increased blood flow that requires you to use more pads or tampons than usual. ? A change in the number of days that your period lasts. ? A change in symptoms that are associated with your period, such as back pain or cramps in your abdomen.  Keep all follow-up visits as told by your health care provider. This is important, especially if your fibroids need to be monitored for any changes. Contact a health care provider if you:  Have pelvic pain, back pain, or cramps in your abdomen that   do not get better with medicine or heat.  Develop new bleeding between periods.  Have increased bleeding during or between periods.  Feel unusually tired or weak.  Feel light-headed. Get help right away if you:  Faint.  Have pelvic pain that suddenly gets worse.  Have severe vaginal bleeding that soaks a tampon or pad in 30 minutes or less. Summary  Uterine fibroids are  noncancerous (benign) tumors that can develop in the uterus.  The exact cause of this condition is not known.  Most fibroids do not require medical treatment unless they affect your ability to have children (fertility).  Contact a health care provider if you have pelvic pain, back pain, or cramps in your abdomen that do not get better with medicines.  Make sure you know what symptoms should cause you to get help right away. This information is not intended to replace advice given to you by your health care provider. Make sure you discuss any questions you have with your health care provider. Document Revised: 01/21/2017 Document Reviewed: 01/04/2017 Elsevier Patient Education  2020 [place].   Endometrial Biopsy  Endometrial biopsy is a procedure in which a tissue sample is taken from inside the uterus. The sample is taken from the endometrium, which is the lining of the uterus. The tissue sample is then checked under a microscope to see if the tissue is normal or abnormal. This procedure helps to determine where you are in your menstrual cycle and how hormone levels are affecting the lining of the uterus. This procedure may also be used to evaluate uterine bleeding or to diagnose endometrial cancer, endometrial tuberculosis, polyps, or other inflammatory conditions. Tell a health care provider about:  Any allergies you have.  All medicines you are taking, including vitamins, herbs, eye drops, creams, and over-the-counter medicines.  Any problems you or family members have had with anesthetic medicines.  Any blood disorders you have.  Any surgeries you have had.  Any medical conditions you have.  Whether you are pregnant or may be pregnant. What are the risks? Generally, this is a safe procedure. However, problems may occur, including:  Bleeding.  Pelvic infection.  Puncture of the wall of the uterus with the biopsy device (rare). What happens before the  procedure?  Keep a record of your menstrual cycles as told by your health care provider. You may need to schedule your procedure for a specific time in your cycle.  You may want to bring a sanitary pad to wear after the procedure.  Ask your health care provider about: ? Changing or stopping your regular medicines. This is especially important if you are taking diabetes medicines or blood thinners. ? Taking medicines such as aspirin and ibuprofen. These medicines can thin your blood. Do not take these medicines before your procedure if your health care provider instructs you not to.  Plan to have someone take you home from the hospital or clinic. What happens during the procedure?  To lower your risk of infection: ? Your health care team will wash or sanitize their hands.  You will lie on an exam table with your feet and legs supported as in a pelvic exam.  Your health care provider will insert an instrument (speculum) into your vagina to see your cervix.  Your cervix will be cleansed with an antiseptic solution.  A medicine (local anesthetic) will be used to numb the cervix.  A forceps instrument (tenaculum) will be used to hold your cervix steady for the biopsy.  A thin, rod-like instrument (uterine sound) will be inserted through your cervix to determine the length of your uterus and the location where the biopsy sample will be removed.  A thin, flexible tube (catheter) will be inserted through your cervix and into the uterus. The catheter will be used to collect the biopsy sample from your endometrial tissue.  The catheter and speculum will then be removed, and the tissue sample will be sent to a lab for examination. What happens after the procedure?  You will rest in a recovery area until you are ready to go home.  You may have mild cramping and a small amount of vaginal bleeding. This is normal.  It is up to you to get the results of your procedure. Ask your health care  provider, or the department that is doing the procedure, when your results will be ready. Summary  Endometrial biopsy is a procedure in which a tissue sample is taken from the endometrium, which is the lining of the uterus.  This procedure may help to diagnose menstrual cycle problems, abnormal bleeding, or other conditions affecting the endometrium.  Before the procedure, keep a record of your menstrual cycles as told by your health care provider.  The tissue sample that is removed will be checked under a microscope to see if it is normal or abnormal. This information is not intended to replace advice given to you by your health care provider. Make sure you discuss any questions you have with your health care provider. Document Revised: 01/21/2017 Document Reviewed: 02/25/2016 Elsevier Patient Education  2020 [place].  Endometrial Ablation Endometrial ablation is a procedure that destroys the thin inner layer of the lining of the uterus (endometrium). This procedure may be done:  To stop heavy periods.  To stop bleeding that is causing anemia.  To control irregular bleeding.  To treat bleeding caused by small tumors (fibroids) in the endometrium. This procedure is often an alternative to major surgery, such as removal of the uterus and cervix (hysterectomy). As a result of this procedure:  You may not be able to have children. However, if you are premenopausal (you have not gone through menopause): ? You may still have a small chance of getting pregnant. ? You will need to use a reliable method of birth control after the procedure to prevent pregnancy.  You may stop having a menstrual period, or you may have only a small amount of bleeding during your period. Menstruation may return several years after the procedure. Tell a health care provider about:  Any allergies you have.  All medicines you are taking, including vitamins, herbs, eye drops, creams, and over-the-counter  medicines.  Any problems you or family members have had with the use of anesthetic medicines.  Any blood disorders you have.  Any surgeries you have had.  Any medical conditions you have. What are the risks? Generally, this is a safe procedure. However, problems may occur, including:  A hole (perforation) in the uterus or bowel.  Infection of the uterus, bladder, or vagina.  Bleeding.  Damage to other structures or organs.  An air bubble in the lung (air embolus).  Problems with pregnancy after the procedure.  Failure of the procedure.  Decreased ability to diagnose cancer in the endometrium. What happens before the procedure?  You will have tests of your endometrium to make sure there are no pre-cancerous cells or cancer cells present.  You may have an ultrasound of the uterus.  You may be given  medicines to thin the endometrium.  Ask your health care provider about: ? Changing or stopping your regular medicines. This is especially important if you take diabetes medicines or blood thinners. ? Taking medicines such as aspirin and ibuprofen. These medicines can thin your blood. Do not take these medicines before your procedure if your doctor tells you not to.  Plan to have someone take you home from the hospital or clinic. What happens during the procedure?   You will lie on an exam table with your feet and legs supported as in a pelvic exam.  To lower your risk of infection: ? Your health care team will wash or sanitize their hands and put on germ-free (sterile) gloves. ? Your genital area will be washed with soap.  An IV tube will be inserted into one of your veins.  You will be given a medicine to help you relax (sedative).  A surgical instrument with a light and camera (resectoscope) will be inserted into your vagina and moved into your uterus. This allows your surgeon to see inside your uterus.  Endometrial tissue will be removed using one of the following  methods: ? Radiofrequency. This method uses a radiofrequency-alternating electric current to remove the endometrium. ? Cryotherapy. This method uses extreme cold to freeze the endometrium. ? Heated-free liquid. This method uses a heated saltwater (saline) solution to remove the endometrium. ? Microwave. This method uses high-energy microwaves to heat up the endometrium and remove it. ? Thermal balloon. This method involves inserting a catheter with a balloon tip into the uterus. The balloon tip is filled with heated fluid to remove the endometrium. The procedure may vary among health care providers and hospitals. What happens after the procedure?  Your blood pressure, heart rate, breathing rate, and blood oxygen level will be monitored until the medicines you were given have worn off.  As tissue healing occurs, you may notice vaginal bleeding for 4-6 weeks after the procedure. You may also experience: ? Cramps. ? Thin, watery vaginal discharge that is light pink or brown in color. ? A need to urinate more frequently than usual. ? Nausea.  Do not drive for 24 hours if you were given a sedative.  Do not have sex or insert anything into your vagina until your health care provider approves. Summary  Endometrial ablation is done to treat the many causes of heavy menstrual bleeding.  The procedure may be done only after medications have been tried to control the bleeding.  Plan to have someone take you home from the hospital or clinic. This information is not intended to replace advice given to you by your health care provider. Make sure you discuss any questions you have with your health care provider. Document Revised: 07/26/2017 Document Reviewed: 02/26/2016 Elsevier Patient Education  [place].

## 2019-12-21 ENCOUNTER — Other Ambulatory Visit (HOSPITAL_COMMUNITY)
Admission: RE | Admit: 2019-12-21 | Discharge: 2019-12-21 | Disposition: A | Discharge status: Home / Self Care | Payer: Managed Care, Other (non HMO) | Source: Ambulatory Visit | Attending: Certified Nurse Midwife | Admitting: Certified Nurse Midwife

## 2019-12-21 ENCOUNTER — Other Ambulatory Visit: Payer: Self-pay

## 2019-12-21 ENCOUNTER — Encounter: Payer: Self-pay | Admitting: Certified Nurse Midwife

## 2019-12-21 ENCOUNTER — Ambulatory Visit (INDEPENDENT_AMBULATORY_CARE_PROVIDER_SITE_OTHER): Payer: Managed Care, Other (non HMO) | Admitting: Certified Nurse Midwife

## 2019-12-21 VITALS — BP 138/67 | Ht 61.0 in | Wt 135.7 lb

## 2019-12-21 DIAGNOSIS — Z9889 Other specified postprocedural states: Secondary | ICD-10-CM | POA: Diagnosis not present

## 2019-12-21 DIAGNOSIS — N950 Postmenopausal bleeding: Secondary | ICD-10-CM | POA: Diagnosis not present

## 2019-12-21 DIAGNOSIS — D252 Subserosal leiomyoma of uterus: Secondary | ICD-10-CM

## 2019-12-21 MED ORDER — MEDROXYPROGESTERONE ACETATE 10 MG PO TABS: 10.0000 mg | ORAL_TABLET | Freq: Every day | ORAL | 0 refills | Status: DC

## 2019-12-21 NOTE — Patient Instructions (Addendum)
Medroxyprogesterone tablets What is this medicine? MEDROXYPROGESTERONE (me DROX ee proe JES te rone) is a hormone in a class called progestins. It is commonly used to prevent the uterine lining from overgrowth in women taking an estrogen after menopause. It is also used to treat irregular menstrual bleeding or a lack of menstrual bleeding in women. This medicine may be used for other purposes; ask your health care provider or pharmacist if you have questions. COMMON BRAND NAME(S): Amen, Provera What should I tell my health care provider before I take this medicine? They need to know if you have any of these conditions:  blood vessel disease or a history of a blood clot in the lungs or legs  breast, cervical or vaginal cancer  heart disease  kidney disease  liver disease  migraine  recent miscarriage or abortion  mental depression  migraine  seizures (convulsions)  stroke  vaginal bleeding that has not been evaluated  an unusual or allergic reaction to medroxyprogesterone, other medicines, foods, dyes, or preservatives  pregnant or trying to get pregnant  breast-feeding How should I use this medicine? Take this medicine by mouth with a glass of water. Follow the directions on the prescription label. Take your doses at regular intervals. Do not take your medicine more often than directed. Talk to your pediatrician regarding the use of this medicine in children. Special care may be needed. While this drug may be prescribed for children as young as 13 years for selected conditions, precautions do apply. Overdosage: If you think you have taken too much of this medicine contact a poison control center or emergency room at once. NOTE: This medicine is only for you. Do not share this medicine with others. What if I miss a dose? If you miss a dose, take it as soon as you can. If it is almost time for your next dose, take only that dose. Do not take double or extra doses. What may  interact with this medicine?  barbiturate medicines for inducing sleep or treating seizures (convulsions)  bosentan  carbamazepine  phenytoin  rifampin  St. John's Wort This list may not describe all possible interactions. Give your health care provider a list of all the medicines, herbs, non-prescription drugs, or dietary supplements you use. Also tell them if you smoke, drink alcohol, or use illegal drugs. Some items may interact with your medicine. What should I watch for while using this medicine? Visit your health care professional for regular checks on your progress. You will need a regular breast and pelvic exam. If you have any reason to think you are pregnant, stop taking this medicine at once and contact your doctor or health care professional. What side effects may I notice from receiving this medicine? Side effects that you should report to your doctor or health care professional as soon as possible:  breast tenderness or discharge  changes in mood or emotions, such as depression  changes in vision or speech  pain in the abdomen, chest, groin, or leg  severe headache  skin rash, itching, or hives  sudden shortness of breath  unusually weak or tired  yellowing of skin or eyes Side effects that usually do not require medical attention (report to your doctor or health care professional if they continue or are bothersome):  acne  change in menstrual bleeding pattern or flow  changes in sexual desire  facial hair growth  fluid retention and swelling  headache  upset stomach  weight gain or loss This list  may not describe all possible side effects. Call your doctor for medical advice about side effects. You may report side effects to FDA at 1-800-FDA-1088. Where should I keep my medicine? Keep out of the reach of children. Store at room temperature between 20 and 25 degrees C (68 and 77 degrees F). Throw away any unused medicine after the expiration  date. NOTE: This sheet is a summary. It may not cover all possible information. If you have questions about this medicine, talk to your doctor, pharmacist, or health care provider.  2020 Elsevier/Gold Standard (2008-02-08 11:26:12)   Endometrial Biopsy, Care After This sheet gives you information about how to care for yourself after your procedure. Your health care provider may also give you more specific instructions. If you have problems or questions, contact your health care provider. What can I expect after the procedure? After the procedure, it is common to have:  Mild cramping.  A small amount of vaginal bleeding for a few days. This is normal. Follow these instructions at home:   Take over-the-counter and prescription medicines only as told by your health care provider.  Do not douche, use tampons, or have sexual intercourse until your health care provider approves.  Return to your normal activities as told by your health care provider. Ask your health care provider what activities are safe for you.  Follow instructions from your health care provider about any activity restrictions, such as restrictions on strenuous exercise or heavy lifting. Contact a health care provider if:  You have heavy bleeding, or bleed for longer than 2 days after the procedure.  You have bad smelling discharge from your vagina.  You have a fever or chills.  You have a burning sensation when urinating or you have difficulty urinating.  You have severe pain in your lower abdomen. Get help right away if:  You have severe cramps in your stomach or back.  You pass large blood clots.  Your bleeding increases.  You become weak or light-headed, or you pass out. Summary  After the procedure, it is common to have mild cramping and a small amount of vaginal bleeding for a few days.  Do not douche, use tampons, or have sexual intercourse until your health care provider approves.  Return to your  normal activities as told by your health care provider. Ask your health care provider what activities are safe for you. This information is not intended to replace advice given to you by your health care provider. Make sure you discuss any questions you have with your health care provider. Document Revised: 01/21/2017 Document Reviewed: 02/25/2016 Elsevier Patient Education  [place].   Postmenopausal Bleeding  Postmenopausal bleeding is any bleeding that occurs after menopause. Menopause is when a woman's period stops. Any type of bleeding after menopause should be checked by your doctor. Treatment will depend on the cause. Follow these instructions at home:  Pay attention to any changes in your symptoms.  Avoid using tampons and douches as told by your doctor.  Change your pads regularly.  Get regular pelvic exams and Pap tests.  Take iron pills as told by your doctor.  Take over-the-counter and prescription medicines only as told by your doctor.  Keep all follow-up visits as told by your doctor. This is important. Contact a doctor if:  Your bleeding lasts for more than 1 week.  You have pain in your belly (abdomen).  You have bleeding during or after sex.  You have bleeding that happens more  often than every 3 weeks. Get help right away if:  You have fever, chills, headache, dizziness, muscle aches, or bleeding.  You have very bad pain with bleeding.  You have clumps of blood (blood clots) coming from your vagina.  You have a lot of bleeding, and: ? You use more than 1 pad an hour. ? This kind of bleeding has never happened before.  You feel like you are going to pass out (faint). Summary  Any type of bleeding after menopause should be checked by your doctor.  Pay attention to any changes in your symptoms.  Keep all follow-up visits as told by your doctor. This information is not intended to replace advice given to you by your health care provider.  Make sure you discuss any questions you have with your health care provider. Document Revised: 04/27/2018 Document Reviewed: 03/16/2016 Elsevier Patient Education  2020 [employer].

## 2019-12-21 NOTE — Progress Notes (Signed)
PRE-OP DIAGNOSIS: Postmenopausal bleeding, Uterine fibroid, History of ablation POST-OP DIAGNOSIS: Same  PROCEDURE: Endometrial biopsy   PROCEDURE:   A timeout protocol was performed prior to initiating the procedure.  Vaginal speculum was inserted, and the cervix was visualized.   The cervix was cleansed with antiseptic solution and a tenaculum was placed.   The uterus was sounded to 8 cm and the device was inserted through the cervical canal, into the uterine cavity, and up to the fundus.   The instrument was withdrawn as it was rotated 3-4 times to obtain the sample which was sent in formalin to pathology; will contact patient with results.    Followup: The patient tolerated the procedure well without complications.  Standard post-procedure care is explained and return precautions are given.   Dani Gobble, CNM [facility], [facility] 12/21/19 7:20 PM

## 2019-12-25 LAB — SURGICAL PATHOLOGY

## 2020-01-03 ENCOUNTER — Other Ambulatory Visit: Payer: Self-pay | Admitting: Certified Nurse Midwife

## 2020-01-03 DIAGNOSIS — N898 Other specified noninflammatory disorders of vagina: Secondary | ICD-10-CM

## 2020-01-03 MED ORDER — METRONIDAZOLE 500 MG PO TABS: 500.0000 mg | ORAL_TABLET | Freq: Two times a day (BID) | ORAL | 0 refills | Status: DC

## 2020-01-03 NOTE — Progress Notes (Signed)
Rx Flagyl, see orders.    Dani Gobble, CNM [facility], [facility] 01/03/20 7:33 PM

## 2020-03-31 ENCOUNTER — Telehealth: Payer: Self-pay

## 2020-03-31 DIAGNOSIS — R1011 Right upper quadrant pain: Secondary | ICD-10-CM

## 2020-03-31 DIAGNOSIS — N950 Postmenopausal bleeding: Secondary | ICD-10-CM

## 2020-03-31 MED ORDER — NORETHINDRONE ACETATE 5 MG PO TABS: 5.0000 mg | ORAL_TABLET | Freq: Every day | ORAL | 2 refills | Status: DC

## 2020-03-31 NOTE — Telephone Encounter (Signed)
New message    Patient voiced C/o right sided pain & bleeding recently since the last office visit 12/21/19.   Please advise

## 2020-03-31 NOTE — Telephone Encounter (Signed)
1623 Telephone call to patient, verified full name and date of birth.   Reports intermittent right sided pain, question gallbladder.   Notes three episodes of vaginal bleeding since last office visit, had COVID for a second time in January.   Rx Aygestin, see orders. Outpatient gallbladder ultrasound ordered, see chart.    Dani Gobble, CNM [facility], [facility] 03/31/20 4:29 PM

## 2020-03-31 NOTE — Addendum Note (Signed)
Addended by: Cherre Huger on: 03/31/2020 04:30 PM   Modules accepted: Orders

## 2020-04-01 ENCOUNTER — Telehealth: Payer: Self-pay

## 2020-04-01 NOTE — Telephone Encounter (Signed)
Pt aware of appt day and time per SK.

## 2020-04-01 NOTE — Telephone Encounter (Signed)
Pt called in and stated that she talk to [facility] her abdominal pain. The pt said that Sharyn Lull wants to do a u/s and that the place will be calling the pt. I am wanting to know if this patients referral is correct? Will Radiology contact her? Please advise

## 2020-04-07 ENCOUNTER — Telehealth: Payer: Self-pay | Admitting: Certified Nurse Midwife

## 2020-04-07 NOTE — Telephone Encounter (Signed)
lmtrc

## 2020-04-07 NOTE — Telephone Encounter (Signed)
Please contact patient. May submit FMLA paperwork to office if needed. Depending on ultrasound findings can discuss gallbladder surgery. If she is referring to hysterectomy, then please schedule appointment with Dr. Marcelline Mates. Thanks, JML

## 2020-04-07 NOTE — Telephone Encounter (Signed)
Pt aware. She will contact HR and send FMLA paperwork. Pt is aware we require 25.00 and 10 business days to complete.   Per pt she is getting harassed by her supervisor about not working a full 8 hours each day.

## 2020-04-07 NOTE — Telephone Encounter (Signed)
Patient called this morning about gall bladder issues- patient is scheduled for an [place] 2-16. Patient states that she feels a knot in right rib and stomach upset, pain intermittent. Pt mentioned hormone medication did help. Patient's employer has suggested FMLA and patient was asking if this is an option and how to go about that. Patient also wants to know if surgery is an option.

## 2020-04-09 ENCOUNTER — Ambulatory Visit
Admission: RE | Admit: 2020-04-09 | Discharge: 2020-04-09 | Disposition: A | Discharge status: Home / Self Care | Payer: Managed Care, Other (non HMO) | Source: Ambulatory Visit | Attending: Certified Nurse Midwife | Admitting: Certified Nurse Midwife

## 2020-04-09 ENCOUNTER — Other Ambulatory Visit: Payer: Self-pay

## 2020-04-09 DIAGNOSIS — R1011 Right upper quadrant pain: Secondary | ICD-10-CM | POA: Insufficient documentation

## 2020-04-10 ENCOUNTER — Telehealth: Payer: Self-pay | Admitting: Certified Nurse Midwife

## 2020-04-10 NOTE — Telephone Encounter (Signed)
Pt aware.  U/s results not released. Will call when results are available.

## 2020-04-10 NOTE — Telephone Encounter (Signed)
Results have not been released yet. Will contact when available. Thanks, JML

## 2020-04-10 NOTE — Telephone Encounter (Signed)
New message    Calling for ultrasound results

## 2020-04-11 ENCOUNTER — Other Ambulatory Visit: Payer: Self-pay | Admitting: Certified Nurse Midwife

## 2020-04-11 ENCOUNTER — Telehealth: Payer: Self-pay | Admitting: Certified Nurse Midwife

## 2020-04-11 DIAGNOSIS — R1011 Right upper quadrant pain: Secondary | ICD-10-CM

## 2020-04-11 NOTE — Telephone Encounter (Signed)
please

## 2020-04-11 NOTE — Progress Notes (Signed)
Referral to GI, see orders.    Dani Gobble, CNM [facility], [facility] 04/11/20 10:43 AM

## 2020-04-11 NOTE — Telephone Encounter (Signed)
Please advise. Thanks Sahra Converse 

## 2020-04-11 NOTE — Telephone Encounter (Signed)
See previous encounter

## 2020-04-11 NOTE — Telephone Encounter (Signed)
Please contact patient. Ultrasound results appear normal. Will place referral to GI as requested. Thanks, JML

## 2020-04-11 NOTE — Telephone Encounter (Signed)
Pt called this morning after seeing her U/S results online and is asking what next steps would be, she is inquiring about referral to GI for gallbladder issues. Please Advise.

## 2020-04-11 NOTE — Telephone Encounter (Signed)
Pt aware.

## 2020-05-05 ENCOUNTER — Other Ambulatory Visit: Payer: Self-pay

## 2020-05-05 ENCOUNTER — Encounter: Payer: Self-pay | Admitting: Gastroenterology

## 2020-05-05 ENCOUNTER — Ambulatory Visit (INDEPENDENT_AMBULATORY_CARE_PROVIDER_SITE_OTHER): Payer: Managed Care, Other (non HMO) | Admitting: Gastroenterology

## 2020-05-05 VITALS — BP 150/79 | HR 109 | Temp 97.7°F | Ht 61.0 in | Wt 135.0 lb

## 2020-05-05 DIAGNOSIS — R1011 Right upper quadrant pain: Secondary | ICD-10-CM

## 2020-05-05 NOTE — Progress Notes (Signed)
Gastroenterology Consultation  Referring Provider:     Sharlette Dense* Primary Care Physician:  Idelle Crouch, MD Primary Gastroenterologist:  Dr. Allen Norris     Reason for Consultation:     Right upper quadrant pain        HPI:   Anna Andersen is a 55 y.o. y/o female referred for consultation & management of right upper quadrant pain by Dr. Doy Hutching, Leonie Douglas, MD.  This patient comes in today after being seen in the past by Dr. Alice Reichert with a colonoscopy done by him in 2019.  The colonoscopy at that time was done at an outside facility and the records of that procedure are not accessible to me at this time.  The patient was seen by her gynecologist to set her off for a right upper quadrant ultrasound due to the right upper quadrant pain and notify the patient that it was negative.  The referral was then placed for a GI consult.  The patient's liver enzymes in August 2021 were normal. The patient reports that the pain in the right upper quadrant comes and certain times and was worse a couple weeks ago.  The patient has reported the attacks to come on with a feeling of a softball like mass underneath her right side of her ribs.  She thinks that the symptoms are made worse with eating at times.  There is no report of any black stools or bloody stools.  The patient also denies any unexplained weight loss fevers chills nausea or vomiting.  Past Medical History:  Diagnosis Date  . COVID-19   . Gluten intolerance   . Hypertension     Past Surgical History:  Procedure Laterality Date  . adnoids    . CESAREAN SECTION    . tubes in ears    . WRIST SURGERY Right     Prior to Admission medications   Medication Sig Start Date End Date Taking? Authorizing Provider  ALPRAZolam (XANAX) 0.25 MG tablet Take 0.25 mg by mouth 2 (two) times daily as needed. 04/04/20   [provider]  dapsone 25 MG tablet Take by mouth as directed. Patient not taking: Reported on 12/21/2019 09/10/19    [provider]  medroxyPROGESTERone (PROVERA) 10 MG tablet Take 1 tablet (10 mg total) by mouth daily. Use for ten days 12/21/19   Diona Fanti, CNM  norethindrone (AYGESTIN) 5 MG tablet Take 1 tablet (5 mg total) by mouth daily. 03/31/20   Diona Fanti, CNM  phentermine (ADIPEX-P) 37.5 MG tablet Take 37.5 mg by mouth every morning. 04/04/20   [provider]  pravastatin (PRAVACHOL) 20 MG tablet Take 20 mg by mouth daily. 04/24/20   [provider]  propranolol (INDERAL) 20 MG tablet Take 20 mg by mouth 2 (two) times daily. 08/08/19   [provider]  RHODIOLA ROSEA PO Take by mouth.    [provider]    Family History  Problem Relation Age of Onset  . Cancer Maternal Grandmother   . Cancer Paternal Grandmother   . Ovarian cancer Neg Hx   . Colon cancer Neg Hx   . Diabetes Neg Hx      Social History   Tobacco Use  . Smoking status: Never Smoker  . Smokeless tobacco: Never Used  Vaping Use  . Vaping Use: Never used  Substance Use Topics  . Alcohol use: Never  . Drug use: Never    Allergies as of 05/05/2020 - Review Complete  12/21/2019  Allergen Reaction Noted  . Gluten meal Itching and Rash 01/09/2018    Review of Systems:    All systems reviewed and negative except where noted in HPI.   Physical Exam:  LMP 12/20/2019 (Exact Date)  Patient's last menstrual period was 12/20/2019 (exact date). General:   Alert,  Well-developed, well-nourished, pleasant and cooperative in NAD Head:  Normocephalic and atraumatic. Eyes:  Sclera clear, no icterus.   Conjunctiva pink. Ears:  Normal auditory acuity. Neck:  Supple; no masses or thyromegaly. Lungs:  Respirations even and unlabored.  Clear throughout to auscultation.   No wheezes, crackles, or rhonchi. No acute distress. Heart:  Regular rate and rhythm; no murmurs, clicks, rubs, or gallops. Abdomen:  Normal bowel sounds.  No bruits.  Soft, Positive tenderness with  1 finger palpation with flexion of the abdominal wall musclesand non-distended without masses, hepatosplenomegaly or hernias noted.  No guarding or rebound tenderness.  Positive Carnett sign.   Rectal:  Deferred.  Pulses:  Normal pulses noted. Extremities:  No clubbing or edema.  No cyanosis. Neurologic:  Alert and oriented x3;  grossly normal neurologically. Skin:  Intact without significant lesions or rashes.  No jaundice. Lymph Nodes:  No significant cervical adenopathy. Psych:  Alert and cooperative. Normal mood and affect.  Imaging Studies: US ABDOMEN LIMITED RUQ (LIVER/GB)  Result Date: 04/10/2020 CLINICAL DATA:  Right upper quadrant pain EXAM: ULTRASOUND ABDOMEN LIMITED RIGHT UPPER QUADRANT COMPARISON:  None. FINDINGS: Gallbladder: No gallstones or wall thickening visualized. No sonographic Murphy sign noted by sonographer. Common bile duct: Diameter: 2.7 mm Liver: No focal lesion identified. Within normal limits in parenchymal echogenicity. Portal vein is patent on color Doppler imaging with normal direction of blood flow towards the liver. Other: None. IMPRESSION: Negative examination Electronically Signed   By: Donavan Foil M.D.   On: 04/10/2020 20:49    Assessment and Plan:   Anna Andersen is a 55 y.o. y/o female Who comes in today with right upper quadrant pain that is clearly reproducible with flexion of the abdominal wall muscles by having the patient raise her legs 6 inches above the exam table and palpated with 1 finger lightly.  The patient does have some association of the pain with eating therefore she will be set up for a gallbladder emptying study to completely rule out the gallbladder since her ultrasound was negative for any gallstones or acute cholecystitis.  The patient has been explained the plan and agrees with it.    Lucilla Lame, MD. Marval Regal    Note: This dictation was prepared with Dragon dictation along with smaller phrase technology. Any transcriptional errors  that result from this process are unintentional.

## 2020-05-09 ENCOUNTER — Other Ambulatory Visit: Payer: Self-pay

## 2020-05-09 ENCOUNTER — Ambulatory Visit (INDEPENDENT_AMBULATORY_CARE_PROVIDER_SITE_OTHER): Payer: Managed Care, Other (non HMO) | Admitting: Obstetrics and Gynecology

## 2020-05-09 ENCOUNTER — Encounter: Payer: Self-pay | Admitting: Certified Nurse Midwife

## 2020-05-09 VITALS — BP 158/93 | HR 101 | Ht 61.0 in | Wt 137.0 lb

## 2020-05-09 DIAGNOSIS — N950 Postmenopausal bleeding: Secondary | ICD-10-CM

## 2020-05-09 DIAGNOSIS — D252 Subserosal leiomyoma of uterus: Secondary | ICD-10-CM

## 2020-05-09 DIAGNOSIS — Z9889 Other specified postprocedural states: Secondary | ICD-10-CM

## 2020-05-09 NOTE — Progress Notes (Signed)
Patient is coming in today for follow up from PMB. Patient would like to discuss possible surgery.

## 2020-05-09 NOTE — Progress Notes (Signed)
GYNECOLOGY PROGRESS NOTE  Subjective:    Patient ID: Anna Andersen, female    DOB: 08-08-1965, 55 y.o.   MRN: [id number]  HPI  Patient is a 55 y.o. G9P3003 female who presents for consultation for surgical management of her fibroids and likely postmenopausal bleeding (hormonal levels note patient is in menopause, however she reports still having almost monthly or every other month cycles).  She was previously seen by Anna Andersen, CNM. Anna Andersen has a history of fibroid uterus and abnormal bleeding since February 2021. She also has a history of endometrial ablation in the past. She has had a workup including ultrasound (noting borderline thickened endometrium of 6 mm) and endometrial biopsy (benign) in October 2021. She has been treated with oral progesterone methods such as Provera and Aygestin, however continues to have bleeding.   Patient reports that she has had 3-4 episodes of bleeding in the past 2-3 weeks. She is now ready to discuss surgery.      OB History  Gravida Para Term Preterm AB Living  3 3 3     3   SAB IAB Ectopic Multiple Live Births          3    # Outcome Date GA Lbr Len/2nd Weight Sex Delivery Anes PTL Lv  3 Term 11/29/95   7 lb 15 oz (3.6 kg) M CS-Unspec  N LIV  2 Term 01/16/91   8 lb 7.5 oz (3.841 kg) M CS-Unspec  N LIV  1 Term 08/02/87   8 lb 7 oz (3.827 kg) F CS-Unspec  N LIV    The following portions of the patient's history were reviewed and updated as appropriate:   She  has a past medical history of COVID-19, Gluten intolerance, and Hypertension.   She  has a past surgical history that includes Cesarean section; Wrist surgery (Right); adnoids; and tubes in ears.   Her family history includes Cancer in her maternal grandmother and paternal grandmother.   She  reports that she has never smoked. She has never used smokeless tobacco. She reports that she does not drink alcohol and does not use drugs.    Current Outpatient Medications on File Prior  to Visit  Medication Sig Dispense Refill  . ALPRAZolam (XANAX) 0.25 MG tablet Take 0.25 mg by mouth 2 (two) times daily as needed.    . propranolol (INDERAL) 20 MG tablet Take 20 mg by mouth 2 (two) times daily.    . dapsone 25 MG tablet Take by mouth as directed. (Patient not taking: No sig reported)    . medroxyPROGESTERone (PROVERA) 10 MG tablet Take 1 tablet (10 mg total) by mouth daily. Use for ten days (Patient not taking: No sig reported) 14 tablet 0  . norethindrone (AYGESTIN) 5 MG tablet Take 1 tablet (5 mg total) by mouth daily. (Patient not taking: No sig reported) 30 tablet 2  . phentermine (ADIPEX-P) 37.5 MG tablet Take 37.5 mg by mouth every morning. (Patient not taking: No sig reported)    . pravastatin (PRAVACHOL) 20 MG tablet Take 20 mg by mouth daily. (Patient not taking: No sig reported)    . RHODIOLA ROSEA PO Take by mouth. (Patient not taking: No sig reported)     No current facility-administered medications on file prior to visit.   She is allergic to gluten meal..  Review of Systems Pertinent items noted in HPI and remainder of comprehensive ROS otherwise negative.   Objective:   Blood pressure (!) 158/93, pulse Marland Kitchen)  101, height 5\' 1"  (1.549 m), weight 137 lb (62.1 kg), last menstrual period 12/20/2019. General appearance: alert and no distress Abdomen: soft, non-tender; bowel sounds normal; no masses,  no organomegaly. Well healed Pfannenstiel incision.  Pelvic: external genitalia normal, rectovaginal septum normal.  Vagina without discharge.  Cervix normal appearing, no lesions and no motion tenderness. present. Uterus mobile, nontender, normal shape and size. Small amount of descensus.   Adnexae non-palpable, nontender bilaterally.  Extremities: extremities normal, atraumatic, no cyanosis or edema Neurologic: Grossly normal   Labs:  Lab Results  Component Value Date   WBC 6.7 10/27/2016   HGB 13.1 10/27/2016   HCT 39.1 10/27/2016   MCV 90 10/27/2016   PLT  397 (H) 10/27/2016    Lab Results  Component Value Date   TSH 2.950 10/27/2016    Results for Anna Andersen, Anna Andersen (MRN [id number]) as of 05/10/2020 20:48  Ref. Range 08/31/2019 10:49  LH Latest Units: mIU/mL 29.3  FSH Latest Units: mIU/mL 58.3  Estradiol Latest Units: pg/mL 11.8  Progesterone Latest Units: ng/mL 0.2    Office Visit on 12/21/2019  Component Date Value Ref Range Status  . SURGICAL PATHOLOGY 12/21/2019    Final-Edited                   Value:SURGICAL PATHOLOGY CASE: [id number] PATIENT: Anna Andersen Surgical Pathology Report     Clinical History: PMB, sub serous leiomyoma of uterus, history of endometrial ablation (cm)     FINAL MICROSCOPIC DIAGNOSIS:  A. ENDOMETRIUM, BIOPSY: - Endometrium and stroma with breakdown - Negative for hyperplasia or malignancy    GROSS DESCRIPTION:  Received in formalin is a 2.3 x 2 x 0.5 cm aggregate of dark red soft tissue/material and bloody mucoid material, submitted in 2 blocks.  SW 12/24/2019    Final Diagnosis performed by Anna Folds, MD.   Electronically signed 12/25/2019 Technical component performed at [facility]. [facility], [place] [address] [place], [place] [address].  Professional component performed at [facility], [place] [address] [place], [place] [address].  Immunohistochemistry Technical component (if applicable) was performed at [facility]. [address], [place], [place]                         [address].   IMMUNOHISTOCHEMISTRY DISCLAIMER (if applicable): Some of these immunohistochemical stains may have been developed and the performance characteristics determine by [facility]. Some may not have been cleared or approved by the U.S. Food and Drug Administration. The FDA has determined that such clearance or approval is not necessary. This test is used for clinical purposes. It should not be regarded as investigational or  for research. This laboratory is certified under the [place] (CLIA-88) as qualified to perform high complexity clinical laboratory testing.  The controls stained appropriately.       Imaging:  Patient Name: TRANISHA TISSUE DOB: 1966/01/06 MRN: [id number] ULTRASOUND REPORT  Location: [facility] Date of Service: 09/27/2019     Indications:AUB   Findings:  The uterus is anteverted and measures 8.8 x 4.5 x 5.8 cm. Echo texture is heterogenous with evidence of focal masses. Within the uterus is a suspected fibroid measuring:  Fibroid 1:Lower anterior body SS 2.9 x 2.9 x 3.3 cm  The Endometrium measures 6 mm.  Right Ovary measures 2.1 x 1.2 x 1.4  cm. It is normal in appearance. Left Ovary measures 2.2  x 1.0 x 2.0 cm. It is normal in appearance. Survey of the adnexa demonstrates no adnexal masses. There is no free fluid in the cul de sac.  Impression: 1. Fibroid uterus as described above.  Recommendations: 1.Clinical correlation with the patient's History and Physical Exam.   Jenine M. Albertine Grates    RDMS   I have reviewed this study and agree with documented findings.    Rubie Maid, MD [facility]   Assessment:    1. Postmenopausal bleeding   2. Subserous leiomyoma of uterus   3. History of endometrial ablation    Plan:   .Patient desires definitive management with hysterectomy.  She has previously had a history of endometrial ablation, as well as 3 prior C-sections. She has had an otherwise negative workup so far. I proposed doing a robotic total hysterectomy and prophylactic bilateral salpingectomy.  No indication for oophorectomy.  Patient agrees with this proposed surgery.  The risks of surgery were discussed in detail with the patient including but not limited to: bleeding which may require transfusion or reoperation; infection which may require antibiotics; injury to bowel,  bladder, ureters or other surrounding organs; need for additional procedures including laparotomy or subsequent procedures secondary to abnormal pathology; formation of adhesions; thromboembolic phenomenon; incisional problems and other postoperative/anesthesia complications.  Patient was also advised that she may be able to discharge home same day of surgery, or possibly remain in house for overnight observation; and expected recovery time after a hysterectomy is 6-8 weeks.  Patient was told that the likelihood that her condition and symptoms will be treated effectively with this surgical management was very high; the postoperative expectations were also discussed in detail. The patient also understands the alternative treatment options which were discussed in full. All questions were answered.  She was told that she will be contacted by our surgical scheduler regarding the time and date of her surgery; routine preoperative instructions will be given to her by the preoperative nursing team.   She is aware of need for preoperative COVID testing and subsequent quarantine from time of test to time of surgery; she will be given further preoperative instructions at that [place] screening visit.   Routine postoperative instructions will be reviewed with the patient in detail after surgery.  In the meantime, she will initiate Myomfembree (samples given); bleeding precautions were reviewed. Printed patient education handouts about the procedure was given to the patient to review at home. Surgery tentatively scheduled for 06/16/2020. To return in 4 weeks for pre-op visit.    A total of 25 minutes were spent face-to-face with the patient during this encounter and over half of that time involved counseling and coordination of care.   Rubie Maid, MD [facility]

## 2020-05-10 ENCOUNTER — Encounter: Payer: Self-pay | Admitting: Obstetrics and Gynecology

## 2020-05-10 NOTE — Patient Instructions (Signed)
Hysterectomy Information °A hysterectomy is a surgery in which the uterus is removed. The lowest part of the uterus (cervix), which opens into the vagina, may be removed as well. In some cases, the fallopian tubes, the ovaries,  or both the fallopian tubes and the ovaries may also be removed. °This procedure may be done to treat different medical problems. It may also be done to help transgender men feel more masculine. After the procedure, a woman will no longer have menstrual periods and will not be able to become pregnant (sterile). °What are the reasons for a hysterectomy? °There are many reasons why a person might have this procedure. They include: °Persistent, abnormal vaginal bleeding. °Long-term (chronic) pelvic pain or infection. °Endometriosis. This is when the lining of the uterus (endometrium) starts to grow outside the uterus. °Adenomyosis. This is when the endometrium starts to grow in the muscle of the uterus. °Pelvic organ prolapse. This is a condition in which the uterus falls down into the vagina. °Noncancerous growths in the uterus (uterine fibroids) that cause symptoms. °The presence of precancerous cells. °Cervical or uterine cancer. °Sex change. This helps a transgender man complete his female identity. °What are the different types of hysterectomy? °There are three different types of hysterectomy: °Supracervical hysterectomy. In this type, the top part of the uterus is removed, but not the cervix. °Total hysterectomy. In this type, the uterus and cervix are removed. °Radical hysterectomy. In this type, the uterus, the cervix, and the tissue that holds the uterus in place (parametrium) are removed. °What are the different ways a hysterectomy can be performed? °There are many different ways a hysterectomy can be performed, including: °Abdominal hysterectomy. In this type, an incision is made in the abdomen. The uterus is removed through this incision. °Vaginal hysterectomy. In this type, an  incision is made in the vagina. The uterus is removed through this incision. There are no abdominal incisions. °Conventional laparoscopic hysterectomy. In this type, 3 or 4 small incisions are made in the abdomen. A thin, lighted tube with a camera (laparoscope) is inserted into one of the incisions. Other tools are put through the other incisions. The uterus is cut into small pieces. The small pieces are removed through the incisions or the vagina. °Laparoscopically assisted vaginal hysterectomy (LAVH). In this type, 3 or 4 small incisions are made in the abdomen. Part of the surgery is performed laparoscopically and the other part is done vaginally. The uterus is removed through the vagina. °Robot-assisted laparoscopic hysterectomy. In this type, a laparoscope and other tools are inserted into 3 or 4 small incisions in the abdomen. A computer-controlled device is used to give the surgeon a 3D image and to help control the surgical instruments. This allows for more precise movements of surgical instruments. The uterus is cut into small pieces and removed through the incisions or the vagina. °Discuss the options with your health care provider to determine which type is the right one for you. °What are the risks of this surgery? °Generally, this is a safe procedure. However, problems may occur, including: °Bleeding and risk of blood transfusion. Tell your health care provider if you do not want to receive any blood products. °Blood clots in the legs or lung. °Infection. °Damage to nearby structures or organs. °Allergic reactions to medicines. °Having to change to an abdominal hysterectomy after starting a less invasive technique. °What to expect after a hysterectomy °You will be given pain medicine. °You may need to stay in the hospital for 1-2 days   to recover, depending on the type of hysterectomy you had. °You will need to have someone with you for the first 3-5 days after you go home. °You will need to follow up  with your surgeon in 2-4 weeks after surgery to evaluate your progress. °If the ovaries are removed, you will have early menopause symptoms such as hot flashes, night sweats, and insomnia. °If you had a hysterectomy for a problem that was not cancer or a condition that could not lead to cancer, then you no longer need Pap tests. However, even if you no longer need a Pap test, get regular pelvic exams to make sure no other problems are developing. °Questions to ask your health care provider °Is a hysterectomy medically necessary? Do I have other treatment options for my condition? °What are my options for hysterectomy procedure? °What organs and tissues need to be removed? °What are the risks? °What are the benefits? °How long will I need to stay in the hospital after the procedure? °How long will I need to recover at home? °What symptoms can I expect after the procedure? °Summary °A hysterectomy is a surgery in which the uterus is removed. The fallopian tubes, the ovaries, or both may be removed as well. °This procedure may be done to treat different medical problems. It may also be done to complete your female identity during a sex change. °After the procedure, a woman will no longer have a menstrual period and will not be able to become pregnant. °There are three types of hysterectomy. Discuss with your health care provider which options are right for you. °This is a safe procedure, though there are some risks. Risks include infection, bleeding, blood clots, or damage to nearby organs. °This information is not intended to replace advice given to you by your health care provider. Make sure you discuss any questions you have with your health care provider. °Document Revised: 12/08/2018 Document Reviewed: 12/08/2018 °Elsevier Patient Education © 2021 [employer] Inc[employer] ° °

## 2020-05-13 DIAGNOSIS — Z0289 Encounter for other administrative examinations: Secondary | ICD-10-CM

## 2020-05-14 ENCOUNTER — Telehealth: Payer: Self-pay | Admitting: Obstetrics and Gynecology

## 2020-05-14 NOTE — Telephone Encounter (Signed)
error 

## 2020-05-15 ENCOUNTER — Telehealth: Payer: Self-pay

## 2020-05-15 ENCOUNTER — Telehealth: Payer: Self-pay | Admitting: Family Medicine

## 2020-05-15 NOTE — Telephone Encounter (Signed)
Pt called asking for letter requested to be refaxed- they did not received. Pt also asking to have a copy faxed  To [phone] so she is able to have a copy that is her direct fax.

## 2020-05-15 NOTE — Telephone Encounter (Signed)
Pt called back. Informed pt of the information concerning her fmla forms and working for home. All questions were answered concerning her FMLA forms. Pt voiced that she understood.

## 2020-05-15 NOTE — Telephone Encounter (Signed)
Pt called no answer LM via VM. Informed pt that I had spoke to Dr. Marcelline Mates and informed her that [facility] approved for her to work for home 2 weeks after having surgery. Pt was advised to contact the office if she had issues or concerns.

## 2020-05-16 NOTE — Telephone Encounter (Signed)
Pt called no answer LM via VM checking to see if she received her FMLA forms via fax. Pt was advised that if she did not receive the information that we could refax it again, she could come by the office and pick up a copy or we could email the forms to her.

## 2020-05-21 NOTE — Telephone Encounter (Signed)
completed

## 2020-05-26 ENCOUNTER — Telehealth: Payer: Self-pay | Admitting: Obstetrics and Gynecology

## 2020-05-26 NOTE — Telephone Encounter (Signed)
Patient states that she has been bleeding bad for the past 3 weeks - with clots. Pt is using hormone medication provided, pt is scheduled for sx on 4-25. Pt has been unable to work- FMLA has already been completed. Pt is requesting date changes for FMLA due to not being able to work. Pt states that she has communicated to her HR. Pt states FMLA might be faxed again.

## 2020-05-28 ENCOUNTER — Ambulatory Visit: Payer: Managed Care, Other (non HMO)

## 2020-05-28 NOTE — Telephone Encounter (Signed)
Spoke to pt concerning her call to the office. Pt stated that she needed to her letter with the office letterhead stating that due to her medical issues that she needed to start her leave on May 26, 2020 instead of starting on June 16, 2020. Pt stated that her HR office stated that she needed to letter to cover her since she has been having issues. Pt is requesting for the letter to completed this week.

## 2020-05-28 NOTE — Telephone Encounter (Signed)
Please advised. Thanks [employer]

## 2020-05-29 NOTE — Telephone Encounter (Signed)
We can right her a letter that states due to her medical condition we recommend light duty (including working from home if possible) starting April 4th, and that from April 25th she will be out of work completely.   Dr. Marcelline Mates

## 2020-05-30 NOTE — Telephone Encounter (Signed)
Spoke to pt concerning her FMLA form. Got information needed to complete form.

## 2020-06-05 ENCOUNTER — Encounter: Payer: Self-pay | Admitting: Obstetrics and Gynecology

## 2020-06-05 ENCOUNTER — Ambulatory Visit (INDEPENDENT_AMBULATORY_CARE_PROVIDER_SITE_OTHER): Payer: Managed Care, Other (non HMO) | Admitting: Obstetrics and Gynecology

## 2020-06-05 ENCOUNTER — Other Ambulatory Visit: Payer: Self-pay

## 2020-06-05 VITALS — BP 149/91 | HR 74 | Ht 61.0 in | Wt 138.9 lb

## 2020-06-05 DIAGNOSIS — D252 Subserosal leiomyoma of uterus: Secondary | ICD-10-CM

## 2020-06-05 DIAGNOSIS — Z9889 Other specified postprocedural states: Secondary | ICD-10-CM

## 2020-06-05 DIAGNOSIS — N950 Postmenopausal bleeding: Secondary | ICD-10-CM | POA: Insufficient documentation

## 2020-06-05 NOTE — H&P (View-Only) (Signed)
GYNECOLOGY PREOPERATIVE HISTORY AND PHYSICAL   Subjective:  Anna Andersen is a 55 y.o. [phone] here for surgical management of postmenopausal bleeding and uterine fibroid.  She has had a prior history of endometrial ablation.  Also with prior history of C-section x 3. No significant preoperative concerns.  Proposed surgery: Robotic total hysterectomy with bilateral salpingectomy.     Pertinent Gynecological History: Menses: post-menopausal Bleeding: post menopausal bleeding Contraception: post menopausal status  Last pap: normal Date: 08/2016.    Past Medical History:  Diagnosis Date  . COVID-19   . Gluten intolerance   . Hypertension     Past Surgical History:  Procedure Laterality Date  . adnoids    . CESAREAN SECTION    . tubes in ears    . WRIST SURGERY Right     OB History  Gravida Para Term Preterm AB Living  3 3 3     3   SAB IAB Ectopic Multiple Live Births          3    # Outcome Date GA Lbr Len/2nd Weight Sex Delivery Anes PTL Lv  3 Term 11/29/95   7 lb 15 oz (3.6 kg) M CS-Unspec  N LIV  2 Term 01/16/91   8 lb 7.5 oz (3.841 kg) M CS-Unspec  N LIV  1 Term 08/02/87   8 lb 7 oz (3.827 kg) F CS-Unspec  N LIV    Family History  Problem Relation Age of Onset  . Cancer Maternal Grandmother   . Cancer Paternal Grandmother   . Ovarian cancer Neg Hx   . Colon cancer Neg Hx   . Diabetes Neg Hx    Social History   Socioeconomic History  . Marital status: Divorced    Spouse name: Not on file  . Number of children: Not on file  . Years of education: Not on file  . Highest education level: Not on file  Occupational History  . Not on file  Tobacco Use  . Smoking status: Never Smoker  . Smokeless tobacco: Never Used  Vaping Use  . Vaping Use: Never used  Substance and Sexual Activity  . Alcohol use: Never  . Drug use: Never  . Sexual activity: Yes    Birth control/protection: None  Other Topics Concern  . Not on file  Social History  Narrative  . Not on file   Social Determinants of Health   Financial Resource Strain: Not on file  Food Insecurity: Not on file  Transportation Needs: Not on file  Physical Activity: Not on file  Stress: Not on file  Social Connections: Not on file  Intimate Partner Violence: Not on file    Current Outpatient Medications on File Prior to Visit  Medication Sig Dispense Refill  . ALPRAZolam (XANAX) 0.25 MG tablet Take 0.25 mg by mouth 2 (two) times daily as needed for anxiety.    . propranolol (INDERAL) 40 MG tablet Take 40 mg by mouth daily.    . Relugolix-Estradiol-Norethind (MYFEMBREE) 40-1-0.5 MG TABS Take 1 tablet by mouth daily.     No current facility-administered medications on file prior to visit.   Allergies  Allergen Reactions  . Gluten Meal Itching and Rash     Review of Systems Constitutional: No recent fever/chills/sweats Respiratory: No recent cough/bronchitis Cardiovascular: No chest pain Gastrointestinal: No recent nausea/vomiting/diarrhea Genitourinary: No UTI symptoms Hematologic/lymphatic:No history of coagulopathy or recent blood thinner use    Objective:   Blood pressure (!) 149/91, pulse  74, height 5\' 1"  (1.549 m), weight 138 lb 14.4 oz (63 kg), last menstrual period 12/20/2019. CONSTITUTIONAL: Well-developed, well-nourished female in no acute distress.  HENT:  Normocephalic, atraumatic, External right and left ear normal. Oropharynx is clear and moist EYES: Conjunctivae and EOM are normal. Pupils are equal, round, and reactive to light. No scleral icterus.  NECK: Normal range of motion, supple, no masses SKIN: Skin is warm and dry. No rash noted. Not diaphoretic. No erythema. No pallor. NEUROLOGIC: Alert and oriented to person, place, and time. Normal reflexes, muscle tone coordination. No cranial nerve deficit noted. PSYCHIATRIC: Normal mood and affect. Normal behavior. Normal judgment and thought content. CARDIOVASCULAR: Normal heart rate noted,  regular rhythm RESPIRATORY: Effort and breath sounds normal, no problems with respiration noted ABDOMEN: Soft, nontender, nondistended.  Retracted Pfannenstiel scar PELVIC: Cervix normal appearing, no lesions and no motion tenderness. present. Uterus mobile, nontender, normal shape and size. Small amount of descensus.   Adnexae non-palpable, nontender bilaterally.  MUSCULOSKELETAL: Normal range of motion. No edema and no tenderness. 2+ distal pulses.    Labs: Results for Anna Andersen, Anna Andersen (MRN [id number]) as of 05/10/2020 20:48  Ref. Range 08/31/2019 10:49  LH Latest Units: mIU/mL 29.3  FSH Latest Units: mIU/mL 58.3  Estradiol Latest Units: pg/mL 11.8  Progesterone Latest Units: ng/mL 0.2            Office Visit on 12/21/2019  Component Date Value Ref Range Status   . SURGICAL PATHOLOGY 12/21/2019    Final-Edited                   Value:SURGICAL PATHOLOGY CASE: Anna Andersen PATIENT: Anna Andersen Surgical Pathology Report     Clinical History: PMB, sub serous leiomyoma of uterus, history of endometrial ablation (cm)     FINAL MICROSCOPIC DIAGNOSIS:  A. ENDOMETRIUM, BIOPSY: - Endometrium and stroma with breakdown - Negative for hyperplasia or malignancy    GROSS DESCRIPTION:  Received in formalin is a 2.3 x 2 x 0.5 cm aggregate of dark red soft tissue/material and bloody mucoid material, submitted in 2 blocks.  SW 12/24/2019    Final Diagnosis performed by Jaquita Folds, MD.   Electronically signed 12/25/2019 Technical component performed at [facility]. [facility], [place] [address], [place], [place] [address].  Professional component performed at [facility], [place] [address], [place], [place] [address].  Immunohistochemistry Technical component (if applicable) was performed at [facility]. [address], [place], [place]                         [address].   IMMUNOHISTOCHEMISTRY  DISCLAIMER (if applicable): Some of these immunohistochemical stains may have been developed and the performance characteristics determine by [facility]. Some may not have been cleared or approved by the U.S. Food and Drug Administration. The FDA has determined that such clearance or approval is not necessary. This test is used for clinical purposes. It should not be regarded as investigational or for research. This laboratory is certified under the [place] (CLIA-88) as qualified to perform high complexity clinical laboratory testing.  The controls stained appropriately.      Imaging Studies: Patient Name: RAYVN RICKERSON DOB: 10/17/1965 MRN: [id number] ULTRASOUND REPORT  Location: [facility] Date of Service: 09/27/2019    Indications:AUB  Findings: The uterus is antevertedand measures 8.8 x 4.5 x 5.8 cm. Echo texture is heterogenouswithevidence of focal masses.  Within the uterus is a suspected fibroid measuring:  Fibroid 1:Lower anterior body SS 2.9 x 2.9 x 3.3 cm  The Endometriummeasures 6 mm.  Right Ovary measures 2.1 x 1.2 x 1.4 cm. It is normal in appearance. Left Ovary measures 2.2 x 1.0 x 2.0 cm. It is normal in appearance. Survey of the adnexa demonstrates no adnexal masses. There is no free fluid in the cul de sac.  Impression: 1. Fibroid uterus as described above.  Recommendations: 1.Clinical correlation with the patient's History and Physical Exam.   Jenine M. Albertine Grates RDMS   I have reviewed this study and agree with documented findings.    Rubie Maid, MD [facility]  Assessment:    1. Postmenopausal bleeding   2. Subserous leiomyoma of uterus   3. History of endometrial ablation      Plan:    Counseling: Procedure, risks, reasons, benefits and complications (including injury to bowel, bladder, major blood vessel, ureter, bleeding,  possibility of transfusion, infection, or fistula formation) reviewed in detail. Likelihood of success in alleviating the patient's condition was discussed. Routine postoperative instructions will be reviewed with the patient and her family in detail after surgery.  The patient concurred with the proposed plan, giving informed written consent for the surgery.   Preop testing ordered. Instructions reviewed, including NPO after midnight.

## 2020-06-05 NOTE — Progress Notes (Signed)
GYNECOLOGY PROGRESS NOTE  Subjective:    Patient ID: Anna Andersen, female    DOB: 03-14-1965, 55 y.o.   MRN: [id number]  HPI  Patient is a 55 y.o. [id number] female who presents for pre-operative examination.  She is scheduled for robotic hysterectomy with bilateral salpingectomy for menopausal bleeding and uterine fibroid. She was also started on Myfembree last visit to attempt to help with bleeding until her surgery occurred. Patient still reported daily bleeding, however does note a significant improvement in her mood (as also informed by family members). Wonders if she can continue this medication after hysterectomy.   The following portions of the patient's history were reviewed and updated as appropriate: allergies, current medications, past family history, past medical history, past social history, past surgical history and problem list.  Review of Systems Pertinent items noted in HPI and remainder of comprehensive ROS otherwise negative.   Objective:   Blood pressure (!) 149/91, pulse 74, height 5\' 1"  (1.549 m), weight 138 lb 14.4 oz (63 kg), last menstrual period 12/20/2019. General appearance: alert and no distress Abdomen: soft, non-tender; bowel sounds normal; no masses,  no organomegaly.  Moderately retracted Pfannenstiel scar present.  Pelvic: deferred.  See H&P for remainder of exam.    Labs:   Results for Anna, Andersen (MRN [id number]) as of 06/05/2020 21:06  Ref. Range 08/31/2019 10:49  LH Latest Units: mIU/mL 29.3  FSH Latest Units: mIU/mL 58.3  Estradiol Latest Units: pg/mL 11.8  Progesterone Latest Units: ng/mL 0.2    Last TSH and CBC performed 10/09/2019, normal.   Pathology: Office Visit on 12/21/2019  Component Date Value Ref Range Status  . SURGICAL PATHOLOGY 12/21/2019    Final-Edited                   Value:SURGICAL PATHOLOGY CASE: [id number] PATIENT: Anna Andersen Surgical Pathology Report     Clinical History: PMB, sub serous leiomyoma  of uterus, history of endometrial ablation (cm)     FINAL MICROSCOPIC DIAGNOSIS:  A. ENDOMETRIUM, BIOPSY: - Endometrium and stroma with breakdown - Negative for hyperplasia or malignancy    GROSS DESCRIPTION:  Received in formalin is a 2.3 x 2 x 0.5 cm aggregate of dark red soft tissue/material and bloody mucoid material, submitted in 2 blocks.  SW 12/24/2019    Final Diagnosis performed by Anna Folds, MD.   Electronically signed 12/25/2019 Technical component performed at [facility]. [facility], [place] [address], [place], [place] [address].  Professional component performed at [facility], [place] [address], [place],  [address].  Immunohistochemistry Technical component (if applicable) was performed at [facility]. [address], [place], [place]                         [address].   IMMUNOHISTOCHEMISTRY DISCLAIMER (if applicable): Some of these immunohistochemical stains may have been developed and the performance characteristics determine by [facility]. Some may not have been cleared or approved by the U.S. Food and Drug Administration. The FDA has determined that such clearance or approval is not necessary. This test is used for clinical purposes. It should not be regarded as investigational or for research. This laboratory is certified under the [place] (CLIA-88) as qualified to perform high complexity clinical laboratory testing.  The controls stained appropriately.      Imaging:  Patient Name: Anna Andersen DOB: 1965/08/26 MRN: [id number]  ULTRASOUND REPORT  Location: [facility] Date of Service: 09/27/2019    Indications:AUB  Findings: The uterus is antevertedand measures 8.8 x 4.5 x 5.8 cm. Echo texture is heterogenouswithevidence of focal masses. Within the uterus is a suspected fibroid measuring:  Fibroid  1:Lower anterior body SS 2.9 x 2.9 x 3.3 cm  The Endometriummeasures 6 mm.  Right Ovary measures 2.1 x 1.2 x 1.4 cm. It is normal in appearance. Left Ovary measures 2.2 x 1.0 x 2.0 cm. It is normal in appearance. Survey of the adnexa demonstrates no adnexal masses. There is no free fluid in the cul de sac.  Impression: 1. Fibroid uterus as described above.  Recommendations: 1.Clinical correlation with the patient's History and Physical Exam.   Anna Andersen RDMS   I have reviewed this study and agree with documented findings.    Anna Maid, MD [facility]  Assessment:   1. Postmenopausal bleeding   2. Subserous leiomyoma of uterus   3. History of endometrial ablation    Plan:   - Patient desires definitive management with hysterectomy.  She has previously had a history of endometrial ablation, as well as 3 prior C-sections. She has had an otherwise negative workup so far. She was also tried on trial of progesterone and now Myefembree to treat bleeding without success. I proposed doing a robotic total hysterectomy and prophylactic bilateral salpingectomy due to presence of retracted scar and concern for scar tissue intrabdominally.  No indication for oophorectomy.  Patient agrees with this proposed surgery.  The risks of surgery were discussed in detail with the patient including but not limited to: bleeding which may require transfusion or reoperation; infection which may require antibiotics; injury to bowel, bladder, ureters or other surrounding organs; need for additional procedures including laparotomy or subsequent procedures secondary to abnormal pathology; formation of adhesions; thromboembolic phenomenon; incisional problems and other postoperative/anesthesia complications.  Patient was also advised that she may be able to discharge home same day of surgery, or possibly remain in house for overnight observation; and expected recovery time after a  hysterectomy is 6-8 weeks.  Patient was told that the likelihood that her condition and symptoms will be treated effectively with this surgical management was very high; the postoperative expectations were also discussed in detail. The patient also understands the alternative treatment options which were discussed in full. All questions were answered.  She was told that she will be contacted by our surgical scheduler regarding the time and date of her surgery; routine preoperative instructions will be given to her by the preoperative nursing team.   She is aware of need for preoperative COVID testing and subsequent quarantine from time of test to time of surgery; she will be given further preoperative instructions at that [place] screening visit.   Routine postoperative instructions will be reviewed with the patient in detail after surgery.   - Patient notes improvement with mood after initiation of Myfembree. Advised that this medication would no longer be indicated after her hysterectomy, however can consider other hormonal (and non-hormonal) medications that could help with mood symptoms due to menopause. To discuss after surgery.    Anna Maid, MD [facility]

## 2020-06-05 NOTE — Progress Notes (Signed)
Pt present for pre-op visit for hysterectomy. Pt stated that she was doing well and would like to discuss continue taking Myfembree after surgery due to pt stated that she has noticed a big change in her mood.

## 2020-06-05 NOTE — H&P (Signed)
GYNECOLOGY PREOPERATIVE HISTORY AND PHYSICAL   Subjective:  Anna Andersen is a 55 y.o. [phone] here for surgical management of postmenopausal bleeding and uterine fibroid.  She has had a prior history of endometrial ablation.  Also with prior history of C-section x 3. No significant preoperative concerns.  Proposed surgery: Robotic total hysterectomy with bilateral salpingectomy.     Pertinent Gynecological History: Menses: post-menopausal Bleeding: post menopausal bleeding Contraception: post menopausal status  Last pap: normal Date: 08/2016.    Past Medical History:  Diagnosis Date  . COVID-19   . Gluten intolerance   . Hypertension     Past Surgical History:  Procedure Laterality Date  . adnoids    . CESAREAN SECTION    . tubes in ears    . WRIST SURGERY Right     OB History  Gravida Para Term Preterm AB Living  3 3 3     3   SAB IAB Ectopic Multiple Live Births          3    # Outcome Date GA Lbr Len/2nd Weight Sex Delivery Anes PTL Lv  3 Term 11/29/95   7 lb 15 oz (3.6 kg) M CS-Unspec  N LIV  2 Term 01/16/91   8 lb 7.5 oz (3.841 kg) M CS-Unspec  N LIV  1 Term 08/02/87   8 lb 7 oz (3.827 kg) F CS-Unspec  N LIV    Family History  Problem Relation Age of Onset  . Cancer Maternal Grandmother   . Cancer Paternal Grandmother   . Ovarian cancer Neg Hx   . Colon cancer Neg Hx   . Diabetes Neg Hx    Social History   Socioeconomic History  . Marital status: Divorced    Spouse name: Not on file  . Number of children: Not on file  . Years of education: Not on file  . Highest education level: Not on file  Occupational History  . Not on file  Tobacco Use  . Smoking status: Never Smoker  . Smokeless tobacco: Never Used  Vaping Use  . Vaping Use: Never used  Substance and Sexual Activity  . Alcohol use: Never  . Drug use: Never  . Sexual activity: Yes    Birth control/protection: None  Other Topics Concern  . Not on file  Social History  Narrative  . Not on file   Social Determinants of Health   Financial Resource Strain: Not on file  Food Insecurity: Not on file  Transportation Needs: Not on file  Physical Activity: Not on file  Stress: Not on file  Social Connections: Not on file  Intimate Partner Violence: Not on file    Current Outpatient Medications on File Prior to Visit  Medication Sig Dispense Refill  . ALPRAZolam (XANAX) 0.25 MG tablet Take 0.25 mg by mouth 2 (two) times daily as needed for anxiety.    . propranolol (INDERAL) 40 MG tablet Take 40 mg by mouth daily.    . Relugolix-Estradiol-Norethind (MYFEMBREE) 40-1-0.5 MG TABS Take 1 tablet by mouth daily.     No current facility-administered medications on file prior to visit.   Allergies  Allergen Reactions  . Gluten Meal Itching and Rash     Review of Systems Constitutional: No recent fever/chills/sweats Respiratory: No recent cough/bronchitis Cardiovascular: No chest pain Gastrointestinal: No recent nausea/vomiting/diarrhea Genitourinary: No UTI symptoms Hematologic/lymphatic:No history of coagulopathy or recent blood thinner use    Objective:   Blood pressure (!) 149/91, pulse  74, height 5\' 1"  (1.549 m), weight 138 lb 14.4 oz (63 kg), last menstrual period 12/20/2019. CONSTITUTIONAL: Well-developed, well-nourished female in no acute distress.  HENT:  Normocephalic, atraumatic, External right and left ear normal. Oropharynx is clear and moist EYES: Conjunctivae and EOM are normal. Pupils are equal, round, and reactive to light. No scleral icterus.  NECK: Normal range of motion, supple, no masses SKIN: Skin is warm and dry. No rash noted. Not diaphoretic. No erythema. No pallor. NEUROLOGIC: Alert and oriented to person, place, and time. Normal reflexes, muscle tone coordination. No cranial nerve deficit noted. PSYCHIATRIC: Normal mood and affect. Normal behavior. Normal judgment and thought content. CARDIOVASCULAR: Normal heart rate noted,  regular rhythm RESPIRATORY: Effort and breath sounds normal, no problems with respiration noted ABDOMEN: Soft, nontender, nondistended.  Retracted Pfannenstiel scar PELVIC: Cervix normal appearing, no lesions and no motion tenderness. present. Uterus mobile, nontender, normal shape and size. Small amount of descensus.   Adnexae non-palpable, nontender bilaterally.  MUSCULOSKELETAL: Normal range of motion. No edema and no tenderness. 2+ distal pulses.    Labs: Results for CARLY, SABO (MRN [id number]) as of 05/10/2020 20:48  Ref. Range 08/31/2019 10:49  LH Latest Units: mIU/mL 29.3  FSH Latest Units: mIU/mL 58.3  Estradiol Latest Units: pg/mL 11.8  Progesterone Latest Units: ng/mL 0.2            Office Visit on 12/21/2019  Component Date Value Ref Range Status   . SURGICAL PATHOLOGY 12/21/2019    Final-Edited                   Value:SURGICAL PATHOLOGY CASE: [id number] PATIENT: Anna Andersen Surgical Pathology Report     Clinical History: PMB, sub serous leiomyoma of uterus, history of endometrial ablation (cm)     FINAL MICROSCOPIC DIAGNOSIS:  A. ENDOMETRIUM, BIOPSY: - Endometrium and stroma with breakdown - Negative for hyperplasia or malignancy    GROSS DESCRIPTION:  Received in formalin is a 2.3 x 2 x 0.5 cm aggregate of dark red soft tissue/material and bloody mucoid material, submitted in 2 blocks.  SW 12/24/2019    Final Diagnosis performed by Jaquita Folds, MD.   Electronically signed 12/25/2019 Technical component performed at [facility]. [facility], [place] [address] [place], [place] [address].  Professional component performed at [facility], [place] [address] [place], [place] [address].  Immunohistochemistry Technical component (if applicable) was performed at [facility]. [address], [place], [place]                         [address].   IMMUNOHISTOCHEMISTRY  DISCLAIMER (if applicable): Some of these immunohistochemical stains may have been developed and the performance characteristics determine by [facility]. Some may not have been cleared or approved by the U.S. Food and Drug Administration. The FDA has determined that such clearance or approval is not necessary. This test is used for clinical purposes. It should not be regarded as investigational or for research. This laboratory is certified under the [employer] (CLIA-88) as qualified to perform high complexity clinical laboratory testing.  The controls stained appropriately.      Imaging Studies: Patient Name: JORGIA MANTHEI DOB: Dec 09, 1965 MRN: [id number] ULTRASOUND REPORT  Location: [facility] Date of Service: 09/27/2019    Indications:AUB  Findings: The uterus is antevertedand measures 8.8 x 4.5 x 5.8 cm. Echo texture is heterogenouswithevidence of focal masses.  Within the uterus is a suspected fibroid measuring:  Fibroid 1:Lower anterior body SS 2.9 x 2.9 x 3.3 cm  The Endometriummeasures 6 mm.  Right Ovary measures 2.1 x 1.2 x 1.4 cm. It is normal in appearance. Left Ovary measures 2.2 x 1.0 x 2.0 cm. It is normal in appearance. Survey of the adnexa demonstrates no adnexal masses. There is no free fluid in the cul de sac.  Impression: 1. Fibroid uterus as described above.  Recommendations: 1.Clinical correlation with the patient's History and Physical Exam.   Jenine M. Albertine Grates RDMS   I have reviewed this study and agree with documented findings.    Rubie Maid, MD [facility]  Assessment:    1. Postmenopausal bleeding   2. Subserous leiomyoma of uterus   3. History of endometrial ablation      Plan:    Counseling: Procedure, risks, reasons, benefits and complications (including injury to bowel, bladder, major blood vessel, ureter, bleeding,  possibility of transfusion, infection, or fistula formation) reviewed in detail. Likelihood of success in alleviating the patient's condition was discussed. Routine postoperative instructions will be reviewed with the patient and her family in detail after surgery.  The patient concurred with the proposed plan, giving informed written consent for the surgery.   Preop testing ordered. Instructions reviewed, including NPO after midnight.

## 2020-06-09 ENCOUNTER — Other Ambulatory Visit: Payer: Self-pay

## 2020-06-09 ENCOUNTER — Encounter: Payer: Self-pay | Admitting: *Deleted

## 2020-06-09 ENCOUNTER — Encounter
Admission: RE | Admit: 2020-06-09 | Discharge: 2020-06-09 | Disposition: A | Discharge status: Home / Self Care | Payer: Managed Care, Other (non HMO) | Source: Ambulatory Visit | Attending: Obstetrics and Gynecology | Admitting: Obstetrics and Gynecology

## 2020-06-09 HISTORY — DX: Anxiety disorder, unspecified: F41.9

## 2020-06-09 HISTORY — DX: Hyperlipidemia, unspecified: E78.5

## 2020-06-09 NOTE — Patient Instructions (Addendum)
Your procedure is scheduled on:  Monday, April 25 Report to the Registration Desk on the 1st floor of the [facility]. To find out your arrival time, please call [phone] between 1PM - 3PM on: Friday, April 22  REMEMBER: Instructions that are not followed completely may result in serious medical risk, up to and including death; or upon the discretion of your surgeon and anesthesiologist your surgery may need to be rescheduled.  Do not eat food after midnight the night before surgery.  No gum chewing, lozengers or hard candies.  You may however, drink CLEAR liquids up to 2 hours before you are scheduled to arrive for your surgery. Do not drink anything within 2 hours of your scheduled arrival time.  Clear liquids include: - water  - apple juice without pulp - gatorade (not RED, PURPLE, OR BLUE) - black coffee or tea (Do NOT add milk or creamers to the coffee or tea) Do NOT drink anything that is not on this list.  In addition, your doctor has ordered for you to drink the provided  Ensure Pre-Surgery Clear Carbohydrate Drink  Drinking this carbohydrate drink up to two hours before surgery helps to reduce insulin resistance and improve patient outcomes. Please complete drinking 2 hours prior to scheduled arrival time.  TAKE THESE MEDICATIONS THE MORNING OF SURGERY WITH A SIP OF WATER:  1.  Propranolol  One week prior to surgery: starting today, April 18 Stop Anti-inflammatories (NSAIDS) such as Advil, Aleve, Ibuprofen, Motrin, Naproxen, Naprosyn and Aspirin based products such as Excedrin, Goodys Powder, BC Powder. Stop ANY OVER THE COUNTER supplements until after surgery.  No Alcohol for 24 hours before or after surgery.  No Smoking including e-cigarettes for 24 hours prior to surgery.  No chewable tobacco products for at least 6 hours prior to surgery.  No nicotine patches on the day of surgery.  Do not use any "recreational" drugs for at least a week prior to your surgery.   Please be advised that the combination of cocaine and anesthesia may have negative outcomes, up to and including death. If you test positive for cocaine, your surgery will be cancelled.  On the morning of surgery brush your teeth with toothpaste and water, you may rinse your mouth with mouthwash if you wish. Do not swallow any toothpaste or mouthwash.  Do not wear jewelry, make-up, hairpins, clips or nail polish.  Do not wear lotions, powders, or perfumes.   Do not shave body from the neck down 48 hours prior to surgery just in case you cut yourself which could leave a site for infection.  Also, freshly shaved skin may become irritated if using the CHG soap.  Contact lenses, hearing aids and dentures may not be worn into surgery.  Do not bring valuables to the hospital. [facility] is not responsible for any missing/lost belongings or valuables.   Use CHG Soap as directed on instruction sheet.  Notify your doctor if there is any change in your medical condition (cold, fever, infection).  Wear comfortable clothing (specific to your surgery type) to the hospital.  Plan for stool softeners for home use; pain medications have a tendency to cause constipation. You can also help prevent constipation by eating foods high in fiber such as fruits and vegetables and drinking plenty of fluids as your diet allows.  After surgery, you can help prevent lung complications by doing breathing exercises.  Take deep breaths and cough every 1-2 hours. Your doctor may order a device called  an Chiropodist to help you take deep breaths. When coughing or sneezing, hold a pillow firmly against your incision with both hands. This is called "splinting." Doing this helps protect your incision. It also decreases belly discomfort.  If you are being admitted to the hospital overnight, leave your suitcase in the car. After surgery it may be brought to your room.  If you are being discharged the day of  surgery, you will not be allowed to drive home. You will need a responsible adult (18 years or older) to drive you home and stay with you that night.   If you are taking public transportation, you will need to have a responsible adult (18 years or older) with you. Please confirm with your physician that it is acceptable to use public transportation.   Please call the [facility] at [phone] if you have any questions about these instructions.  Surgery Visitation Policy:  Patients undergoing a surgery or procedure may have one family member or support person with them as long as that person is not COVID-19 positive or experiencing its symptoms.  That person may remain in the waiting area during the procedure.  Inpatient Visitation:    Visiting hours are 7 a.m. to 8 p.m. Inpatients will be allowed two visitors daily. The visitors may change each day during the patient's stay. No visitors under the age of 43. Any visitor under the age of 26 must be accompanied by an adult. The visitor must pass COVID-19 screenings, use hand sanitizer when entering and exiting the patient's room and wear a mask at all times, including in the patient's room. Patients must also wear a mask when staff or their visitor are in the room. Masking is required regardless of vaccination status.

## 2020-06-10 ENCOUNTER — Encounter
Admission: RE | Admit: 2020-06-10 | Discharge: 2020-06-10 | Disposition: A | Discharge status: Home / Self Care | Payer: Managed Care, Other (non HMO) | Source: Ambulatory Visit | Attending: Obstetrics and Gynecology | Admitting: Obstetrics and Gynecology

## 2020-06-10 DIAGNOSIS — I10 Essential (primary) hypertension: Secondary | ICD-10-CM | POA: Insufficient documentation

## 2020-06-10 DIAGNOSIS — Z01818 Encounter for other preprocedural examination: Secondary | ICD-10-CM | POA: Diagnosis present

## 2020-06-10 LAB — HEPATITIS C ANTIBODY: HCV Ab: NONREACTIVE

## 2020-06-10 LAB — COMPREHENSIVE METABOLIC PANEL
ALT: 20 U/L (ref 0–44)
AST: 21 U/L (ref 15–41)
Albumin: 4.3 g/dL (ref 3.5–5.0)
Alkaline Phosphatase: 31 U/L — ABNORMAL LOW (ref 38–126)
Anion gap: 9 (ref 5–15)
BUN: 15 mg/dL (ref 6–20)
CO2: 21 mmol/L — ABNORMAL LOW (ref 22–32)
Calcium: 8.9 mg/dL (ref 8.9–10.3)
Chloride: 107 mmol/L (ref 98–111)
Creatinine, Ser: 0.77 mg/dL (ref 0.44–1.00)
GFR, Estimated: >60 mL/min (ref >60)
Glucose, Bld: 105 mg/dL — ABNORMAL HIGH (ref 70–99)
Potassium: 3.8 mmol/L (ref 3.5–5.1)
Sodium: 137 mmol/L (ref 135–145)
Total Bilirubin: 0.8 mg/dL (ref 0.3–1.2)
Total Protein: 8.3 g/dL — ABNORMAL HIGH (ref 6.5–8.1)

## 2020-06-10 LAB — RAPID HIV SCREEN (HIV 1/2 AB+AG)
HIV 1/2 Antibodies: NONREACTIVE
HIV-1 P24 Antigen - HIV24: NONREACTIVE

## 2020-06-10 LAB — TYPE AND SCREEN
ABO/RH(D): A POS
Antibody Screen: NEGATIVE

## 2020-06-10 LAB — CBC
HCT: 40.9 % (ref 36.0–46.0)
Hemoglobin: 13.9 g/dL (ref 12.0–15.0)
MCH: 30.1 pg (ref 26.0–34.0)
MCHC: 34 g/dL (ref 30.0–36.0)
MCV: 88.5 fL (ref 80.0–100.0)
Platelets: 366 10*3/uL (ref 150–400)
RBC: 4.62 MIL/uL (ref 3.87–5.11)
RDW: 12 % (ref 11.5–15.5)
WBC: 8.3 10*3/uL (ref 4.0–10.5)
nRBC: 0 % (ref 0.0–0.2)

## 2020-06-11 LAB — RPR: RPR Ser Ql: NONREACTIVE

## 2020-06-12 ENCOUNTER — Other Ambulatory Visit: Payer: Self-pay

## 2020-06-12 ENCOUNTER — Other Ambulatory Visit
Admission: RE | Admit: 2020-06-12 | Discharge: 2020-06-12 | Disposition: A | Discharge status: Home / Self Care | Payer: Managed Care, Other (non HMO) | Source: Ambulatory Visit | Attending: Obstetrics and Gynecology | Admitting: Obstetrics and Gynecology

## 2020-06-12 DIAGNOSIS — Z01812 Encounter for preprocedural laboratory examination: Secondary | ICD-10-CM | POA: Diagnosis present

## 2020-06-12 DIAGNOSIS — Z20822 Contact with and (suspected) exposure to covid-19: Secondary | ICD-10-CM | POA: Diagnosis not present

## 2020-06-12 LAB — SARS CORONAVIRUS 2 (TAT 6-24 HRS): SARS Coronavirus 2: NEGATIVE

## 2020-06-16 ENCOUNTER — Ambulatory Visit: Payer: Managed Care, Other (non HMO) | Admitting: Registered Nurse

## 2020-06-16 ENCOUNTER — Ambulatory Visit
Admission: RE | Admit: 2020-06-16 | Discharge: 2020-06-16 | Disposition: A | Discharge status: Home / Self Care | Payer: Managed Care, Other (non HMO) | Attending: Obstetrics and Gynecology | Admitting: Obstetrics and Gynecology

## 2020-06-16 ENCOUNTER — Encounter
Admission: RE | Disposition: A | Discharge status: Home / Self Care | Payer: Self-pay | Source: Home / Self Care | Attending: Obstetrics and Gynecology

## 2020-06-16 ENCOUNTER — Encounter: Payer: Self-pay | Admitting: Obstetrics and Gynecology

## 2020-06-16 DIAGNOSIS — N72 Inflammatory disease of cervix uteri: Secondary | ICD-10-CM | POA: Diagnosis not present

## 2020-06-16 DIAGNOSIS — Z79899 Other long term (current) drug therapy: Secondary | ICD-10-CM | POA: Diagnosis not present

## 2020-06-16 DIAGNOSIS — D252 Subserosal leiomyoma of uterus: Secondary | ICD-10-CM

## 2020-06-16 DIAGNOSIS — N888 Other specified noninflammatory disorders of cervix uteri: Secondary | ICD-10-CM | POA: Insufficient documentation

## 2020-06-16 DIAGNOSIS — N800 Endometriosis of uterus: Secondary | ICD-10-CM

## 2020-06-16 DIAGNOSIS — N838 Other noninflammatory disorders of ovary, fallopian tube and broad ligament: Secondary | ICD-10-CM | POA: Diagnosis not present

## 2020-06-16 DIAGNOSIS — D259 Leiomyoma of uterus, unspecified: Secondary | ICD-10-CM | POA: Diagnosis present

## 2020-06-16 DIAGNOSIS — D271 Benign neoplasm of left ovary: Secondary | ICD-10-CM | POA: Insufficient documentation

## 2020-06-16 DIAGNOSIS — N950 Postmenopausal bleeding: Secondary | ICD-10-CM | POA: Diagnosis not present

## 2020-06-16 DIAGNOSIS — Z8616 Personal history of COVID-19: Secondary | ICD-10-CM | POA: Diagnosis not present

## 2020-06-16 DIAGNOSIS — Z9889 Other specified postprocedural states: Secondary | ICD-10-CM

## 2020-06-16 HISTORY — PX: ROBOTIC ASSISTED LAPAROSCOPIC HYSTERECTOMY AND SALPINGECTOMY: SHX6379

## 2020-06-16 LAB — ABO/RH: ABO/RH(D): A POS

## 2020-06-16 SURGERY — XI ROBOTIC ASSISTED LAPAROSCOPIC HYSTERECTOMY AND SALPINGECTOMY
Anesthesia: General | Laterality: Bilateral

## 2020-06-16 MED ORDER — EPHEDRINE SULFATE 50 MG/ML IJ SOLN
INTRAMUSCULAR | Status: DC | PRN
  Administered 2020-06-16: 5 mg via INTRAVENOUS
  Administered 2020-06-16 (×3): 10 mg via INTRAVENOUS
  Administered 2020-06-16: 5 mg via INTRAVENOUS
  Administered 2020-06-16: 10 mg via INTRAVENOUS

## 2020-06-16 MED ORDER — SIMETHICONE 80 MG PO CHEW: 80.0000 mg | CHEWABLE_TABLET | Freq: Four times a day (QID) | ORAL | 2 refills | Status: DC | PRN

## 2020-06-16 MED ORDER — FAMOTIDINE 20 MG PO TABS
ORAL_TABLET | ORAL | Status: AC
  Administered 2020-06-16: 20 mg via ORAL
  Filled 2020-06-16: qty 1

## 2020-06-16 MED ORDER — ONDANSETRON HCL 4 MG/2ML IJ SOLN
4.0000 mg | Freq: Once | INTRAMUSCULAR | Status: AC | PRN
  Administered 2020-06-16: 4 mg via INTRAVENOUS

## 2020-06-16 MED ORDER — SODIUM CHLORIDE FLUSH 0.9 % IV SOLN
INTRAVENOUS | Status: AC
  Filled 2020-06-16: qty 10

## 2020-06-16 MED ORDER — FAMOTIDINE 20 MG PO TABS: 20.0000 mg | ORAL_TABLET | Freq: Once | ORAL | Status: AC

## 2020-06-16 MED ORDER — DEXAMETHASONE SODIUM PHOSPHATE 10 MG/ML IJ SOLN
INTRAMUSCULAR | Status: AC
  Filled 2020-06-16: qty 1

## 2020-06-16 MED ORDER — EPHEDRINE 5 MG/ML INJ
INTRAVENOUS | Status: AC
  Filled 2020-06-16: qty 10

## 2020-06-16 MED ORDER — LIDOCAINE HCL (PF) 1 % IJ SOLN
INTRAMUSCULAR | Status: AC
  Filled 2020-06-16: qty 30

## 2020-06-16 MED ORDER — PROMETHAZINE HCL 25 MG/ML IJ SOLN: 6.2500 mg | Freq: Once | INTRAMUSCULAR | Status: AC

## 2020-06-16 MED ORDER — ROCURONIUM BROMIDE 10 MG/ML (PF) SYRINGE
PREFILLED_SYRINGE | INTRAVENOUS | Status: AC
  Filled 2020-06-16: qty 10

## 2020-06-16 MED ORDER — METHYLENE BLUE 0.5 % INJ SOLN
INTRAVENOUS | Status: AC
  Filled 2020-06-16: qty 10

## 2020-06-16 MED ORDER — DEXMEDETOMIDINE (PRECEDEX) IN NS 20 MCG/5ML (4 MCG/ML) IV SYRINGE
PREFILLED_SYRINGE | INTRAVENOUS | Status: DC | PRN
Start: 2020-06-16 — End: 2020-06-17
  Administered 2020-06-16: 8 ug via INTRAVENOUS

## 2020-06-16 MED ORDER — SODIUM CHLORIDE 0.9 % IV SOLN
INTRAVENOUS | Status: DC | PRN
  Administered 2020-06-16: 20 ug/min via INTRAVENOUS

## 2020-06-16 MED ORDER — ONDANSETRON HCL 4 MG/2ML IJ SOLN
INTRAMUSCULAR | Status: AC
  Filled 2020-06-16: qty 2

## 2020-06-16 MED ORDER — FENTANYL CITRATE (PF) 100 MCG/2ML IJ SOLN
INTRAMUSCULAR | Status: AC
  Administered 2020-06-16: 25 ug via INTRAVENOUS
  Filled 2020-06-16: qty 2

## 2020-06-16 MED ORDER — FENTANYL CITRATE (PF) 100 MCG/2ML IJ SOLN
INTRAMUSCULAR | Status: DC | PRN
  Administered 2020-06-16 (×2): 50 ug via INTRAVENOUS
  Administered 2020-06-16: 25 ug via INTRAVENOUS

## 2020-06-16 MED ORDER — CEFAZOLIN SODIUM-DEXTROSE 2-4 GM/100ML-% IV SOLN
INTRAVENOUS | Status: AC
  Filled 2020-06-16: qty 100

## 2020-06-16 MED ORDER — MIDAZOLAM HCL 2 MG/2ML IJ SOLN
INTRAMUSCULAR | Status: DC | PRN
  Administered 2020-06-16: 2 mg via INTRAVENOUS

## 2020-06-16 MED ORDER — KETAMINE HCL 50 MG/5ML IJ SOSY
PREFILLED_SYRINGE | INTRAMUSCULAR | Status: AC
  Filled 2020-06-16: qty 5

## 2020-06-16 MED ORDER — GABAPENTIN 300 MG PO CAPS: 300.0000 mg | ORAL_CAPSULE | ORAL | Status: AC

## 2020-06-16 MED ORDER — BUPIVACAINE LIPOSOME 1.3 % IJ SUSP
INTRAMUSCULAR | Status: AC
  Filled 2020-06-16: qty 20

## 2020-06-16 MED ORDER — OXYCODONE HCL 5 MG PO TABS
ORAL_TABLET | ORAL | Status: AC
  Administered 2020-06-16: 5 mg via ORAL
  Filled 2020-06-16: qty 1

## 2020-06-16 MED ORDER — SUGAMMADEX SODIUM 200 MG/2ML IV SOLN
INTRAVENOUS | Status: DC | PRN
  Administered 2020-06-16: 200 mg via INTRAVENOUS

## 2020-06-16 MED ORDER — CHLORHEXIDINE GLUCONATE 0.12 % MT SOLN
OROMUCOSAL | Status: AC
  Administered 2020-06-16: 15 mL via OROMUCOSAL
  Filled 2020-06-16: qty 15

## 2020-06-16 MED ORDER — DOCUSATE SODIUM 100 MG PO CAPS: 100.0000 mg | ORAL_CAPSULE | Freq: Two times a day (BID) | ORAL | 2 refills | Status: DC | PRN

## 2020-06-16 MED ORDER — KETAMINE HCL 10 MG/ML IJ SOLN
INTRAMUSCULAR | Status: DC | PRN
  Administered 2020-06-16: 20 mg via INTRAVENOUS
  Administered 2020-06-16: 10 mg via INTRAVENOUS

## 2020-06-16 MED ORDER — CHLORHEXIDINE GLUCONATE 0.12 % MT SOLN: 15.0000 mL | Freq: Once | OROMUCOSAL | Status: AC

## 2020-06-16 MED ORDER — DEXAMETHASONE SODIUM PHOSPHATE 10 MG/ML IJ SOLN
INTRAMUSCULAR | Status: DC | PRN
  Administered 2020-06-16: 10 mg via INTRAVENOUS

## 2020-06-16 MED ORDER — PROPOFOL 10 MG/ML IV BOLUS
INTRAVENOUS | Status: AC
  Filled 2020-06-16: qty 20

## 2020-06-16 MED ORDER — GABAPENTIN 300 MG PO CAPS
ORAL_CAPSULE | ORAL | Status: AC
  Administered 2020-06-16: 300 mg via ORAL
  Filled 2020-06-16: qty 1

## 2020-06-16 MED ORDER — BUPIVACAINE LIPOSOME 1.3 % IJ SUSP
INTRAMUSCULAR | Status: DC | PRN
  Administered 2020-06-16: 20 mL

## 2020-06-16 MED ORDER — LACTATED RINGERS IV SOLN: INTRAVENOUS | Status: DC

## 2020-06-16 MED ORDER — ROCURONIUM BROMIDE 100 MG/10ML IV SOLN
INTRAVENOUS | Status: DC | PRN
Start: 2020-06-16 — End: 2020-06-17
  Administered 2020-06-16: 50 mg via INTRAVENOUS
  Administered 2020-06-16: 10 mg via INTRAVENOUS
  Administered 2020-06-16: 30 mg via INTRAVENOUS

## 2020-06-16 MED ORDER — FENTANYL CITRATE (PF) 100 MCG/2ML IJ SOLN
25.0000 ug | INTRAMUSCULAR | Status: DC | PRN
  Administered 2020-06-16: 25 ug via INTRAVENOUS

## 2020-06-16 MED ORDER — PROMETHAZINE HCL 25 MG/ML IJ SOLN
6.2500 mg | Freq: Once | INTRAMUSCULAR | Status: AC
  Administered 2020-06-16: 6.25 mg via INTRAVENOUS

## 2020-06-16 MED ORDER — PROPOFOL 10 MG/ML IV BOLUS
INTRAVENOUS | Status: DC | PRN
  Administered 2020-06-16: 150 mg via INTRAVENOUS

## 2020-06-16 MED ORDER — BUPIVACAINE HCL 0.5 % IJ SOLN
INTRAMUSCULAR | Status: DC | PRN
  Administered 2020-06-16: 2 mL
  Administered 2020-06-16: 28 mL

## 2020-06-16 MED ORDER — PROMETHAZINE HCL 25 MG/ML IJ SOLN
INTRAMUSCULAR | Status: AC
  Administered 2020-06-16: 6.25 mg via INTRAVENOUS
  Filled 2020-06-16: qty 1

## 2020-06-16 MED ORDER — LIDOCAINE HCL (CARDIAC) PF 100 MG/5ML IV SOSY
PREFILLED_SYRINGE | INTRAVENOUS | Status: DC | PRN
  Administered 2020-06-16: 80 mg via INTRAVENOUS

## 2020-06-16 MED ORDER — GLYCOPYRROLATE 0.2 MG/ML IJ SOLN
INTRAMUSCULAR | Status: DC | PRN
  Administered 2020-06-16: .2 mg via INTRAVENOUS

## 2020-06-16 MED ORDER — FENTANYL CITRATE (PF) 100 MCG/2ML IJ SOLN
INTRAMUSCULAR | Status: AC
  Filled 2020-06-16: qty 2

## 2020-06-16 MED ORDER — LIDOCAINE HCL (PF) 2 % IJ SOLN
INTRAMUSCULAR | Status: AC
  Filled 2020-06-16: qty 5

## 2020-06-16 MED ORDER — OXYCODONE-ACETAMINOPHEN 5-325 MG PO TABS
ORAL_TABLET | ORAL | Status: AC
  Administered 2020-06-16: 1
  Filled 2020-06-16: qty 1

## 2020-06-16 MED ORDER — POVIDONE-IODINE 10 % EX SWAB: 2.0000 "application " | Freq: Once | CUTANEOUS | Status: DC

## 2020-06-16 MED ORDER — OXYCODONE-ACETAMINOPHEN 5-325 MG PO TABS: 1.0000 | ORAL_TABLET | Freq: Four times a day (QID) | ORAL | 0 refills | Status: DC | PRN

## 2020-06-16 MED ORDER — SUCCINYLCHOLINE CHLORIDE 200 MG/10ML IV SOSY
PREFILLED_SYRINGE | INTRAVENOUS | Status: AC
  Filled 2020-06-16: qty 10

## 2020-06-16 MED ORDER — IBUPROFEN 800 MG PO TABS
800.0000 mg | ORAL_TABLET | Freq: Three times a day (TID) | ORAL | 0 refills | Status: DC | PRN
Start: 2020-06-16 — End: 2020-07-04

## 2020-06-16 MED ORDER — PHENYLEPHRINE HCL (PRESSORS) 10 MG/ML IV SOLN
INTRAVENOUS | Status: DC | PRN
  Administered 2020-06-16: 100 ug via INTRAVENOUS
  Administered 2020-06-16: 200 ug via INTRAVENOUS
  Administered 2020-06-16: 100 ug via INTRAVENOUS

## 2020-06-16 MED ORDER — GLYCOPYRROLATE 0.2 MG/ML IJ SOLN
INTRAMUSCULAR | Status: AC
  Filled 2020-06-16: qty 1

## 2020-06-16 MED ORDER — BUPIVACAINE HCL (PF) 0.5 % IJ SOLN
INTRAMUSCULAR | Status: AC
  Filled 2020-06-16: qty 30

## 2020-06-16 MED ORDER — ONDANSETRON HCL 4 MG/2ML IJ SOLN
INTRAMUSCULAR | Status: DC | PRN
  Administered 2020-06-16: 4 mg via INTRAVENOUS

## 2020-06-16 MED ORDER — CEFAZOLIN SODIUM-DEXTROSE 2-4 GM/100ML-% IV SOLN
2.0000 g | INTRAVENOUS | Status: AC
  Administered 2020-06-16: 2 g via INTRAVENOUS

## 2020-06-16 MED ORDER — ACETAMINOPHEN 500 MG PO TABS: 1000.0000 mg | ORAL_TABLET | ORAL | Status: AC

## 2020-06-16 MED ORDER — ACETAMINOPHEN 500 MG PO TABS
ORAL_TABLET | ORAL | Status: AC
  Administered 2020-06-16: 1000 mg via ORAL
  Filled 2020-06-16: qty 2

## 2020-06-16 MED ORDER — ORAL CARE MOUTH RINSE: 15.0000 mL | Freq: Once | OROMUCOSAL | Status: AC

## 2020-06-16 MED ORDER — MIDAZOLAM HCL 2 MG/2ML IJ SOLN
INTRAMUSCULAR | Status: AC
  Filled 2020-06-16: qty 2

## 2020-06-16 MED ORDER — OXYCODONE HCL 5 MG PO TABS: 5.0000 mg | ORAL_TABLET | Freq: Once | ORAL | Status: AC

## 2020-06-16 MED ORDER — ACETAMINOPHEN 10 MG/ML IV SOLN
INTRAVENOUS | Status: AC
  Filled 2020-06-16: qty 100

## 2020-06-16 SURGICAL SUPPLY — 70 items
ADH SKN CLS APL DERMABOND .7 (GAUZE/BANDAGES/DRESSINGS) ×2
APL PRP STRL LF DISP 70% ISPRP (MISCELLANEOUS) ×1
BAG DRN RND TRDRP ANRFLXCHMBR (UROLOGICAL SUPPLIES) ×1
BAG URINE DRAIN 2000ML AR STRL (UROLOGICAL SUPPLIES) ×2 IMPLANT
BLADE SURG SZ11 CARB STEEL (BLADE) ×2 IMPLANT
CANISTER SUCT 1200ML W/VALVE (MISCELLANEOUS) ×2 IMPLANT
CATH FOLEY 2WAY  5CC 16FR (CATHETERS) ×1
CATH FOLEY 2WAY 5CC 16FR (CATHETERS) ×1
CATH URTH 16FR FL 2W BLN LF (CATHETERS) ×1 IMPLANT
CHLORAPREP W/TINT 26 (MISCELLANEOUS) ×2 IMPLANT
COVER TIP SHEARS 8 DVNC (MISCELLANEOUS) ×1 IMPLANT
COVER TIP SHEARS 8MM DA VINCI (MISCELLANEOUS) ×1
COVER WAND RF STERILE (DRAPES) ×2 IMPLANT
DEFOGGER SCOPE WARMER CLEARIFY (MISCELLANEOUS) ×2 IMPLANT
DERMABOND ADVANCED (GAUZE/BANDAGES/DRESSINGS) ×2
DERMABOND ADVANCED .7 DNX12 (GAUZE/BANDAGES/DRESSINGS) ×1 IMPLANT
DRAPE 3/4 80X56 (DRAPES) ×4 IMPLANT
DRAPE ARM DVNC X/XI (DISPOSABLE) ×3 IMPLANT
DRAPE COLUMN DVNC XI (DISPOSABLE) ×1 IMPLANT
DRAPE DA VINCI XI ARM (DISPOSABLE) ×3
DRAPE DA VINCI XI COLUMN (DISPOSABLE) ×1
ELECT REM PT RETURN 9FT ADLT (ELECTROSURGICAL) ×2
ELECTRODE REM PT RTRN 9FT ADLT (ELECTROSURGICAL) ×1 IMPLANT
FILTER LAP SMOKE EVAC STRL (MISCELLANEOUS) ×2 IMPLANT
GLOVE SURG ENC MOIS LTX SZ6.5 (GLOVE) ×8 IMPLANT
GLOVE SURG UNDER LTX SZ7 (GLOVE) ×8 IMPLANT
GOWN STRL REUS W/ TWL LRG LVL3 (GOWN DISPOSABLE) ×4 IMPLANT
GOWN STRL REUS W/TWL LRG LVL3 (GOWN DISPOSABLE) ×8
GRASPER SUT TROCAR 14GX15 (MISCELLANEOUS) ×2 IMPLANT
GYRUS RUMI II 2.5CM BLUE (DISPOSABLE)
HEMOSTAT SURGICEL 2X3 (HEMOSTASIS) ×1 IMPLANT
IRRIGATION STRYKERFLOW (MISCELLANEOUS) ×1 IMPLANT
IRRIGATOR STRYKERFLOW (MISCELLANEOUS) ×2
IV NS 1000ML (IV SOLUTION) ×2
IV NS 1000ML BAXH (IV SOLUTION) ×1 IMPLANT
KIT IMAGING PINPOINTPAQ (MISCELLANEOUS) IMPLANT
KIT PINK PAD W/HEAD ARE REST (MISCELLANEOUS) ×2
KIT PINK PAD W/HEAD ARM REST (MISCELLANEOUS) ×1 IMPLANT
LABEL OR SOLS (LABEL) ×2 IMPLANT
MANIFOLD NEPTUNE II (INSTRUMENTS) ×2 IMPLANT
MANIPULATOR VCARE LG CRV RETR (MISCELLANEOUS) IMPLANT
MANIPULATOR VCARE SML CRV RETR (MISCELLANEOUS) ×1 IMPLANT
MANIPULATOR VCARE STD CRV RETR (MISCELLANEOUS) IMPLANT
NEEDLE VERESS 14GA 120MM (NEEDLE) ×2 IMPLANT
NS IRRIG 1000ML POUR BTL (IV SOLUTION) ×2 IMPLANT
OCCLUDER COLPOPNEUMO (BALLOONS) ×2 IMPLANT
PACK GYN LAPAROSCOPIC (MISCELLANEOUS) ×2 IMPLANT
PAD OB MATERNITY 4.3X12.25 (PERSONAL CARE ITEMS) ×2 IMPLANT
PAD PREP 24X41 OB/GYN DISP (PERSONAL CARE ITEMS) ×2 IMPLANT
RUMI II GYRUS 2.5CM BLUE (DISPOSABLE) IMPLANT
SCISSORS METZENBAUM CVD 33 (INSTRUMENTS) ×2 IMPLANT
SEAL CANN UNIV 5-8 DVNC XI (MISCELLANEOUS) ×3 IMPLANT
SEAL XI 5MM-8MM UNIVERSAL (MISCELLANEOUS) ×3
SEALER VESSEL DA VINCI XI (MISCELLANEOUS) ×1
SEALER VESSEL EXT DVNC XI (MISCELLANEOUS) IMPLANT
SET CYSTO W/LG BORE CLAMP LF (SET/KITS/TRAYS/PACK) ×2 IMPLANT
SOLUTION ELECTROLUBE (MISCELLANEOUS) ×2 IMPLANT
SUT DVC VLOC 180 0 12IN GS21 (SUTURE) ×2
SUT MNCRL 4-0 (SUTURE) ×2
SUT MNCRL 4-0 27XMFL (SUTURE) ×1
SUT VIC AB 2-0 CT1 27 (SUTURE) ×2
SUT VIC AB 2-0 CT1 TAPERPNT 27 (SUTURE) ×1 IMPLANT
SUT VICRYL 0 AB UR-6 (SUTURE) ×2 IMPLANT
SUTURE DVC VLC 180 0 12IN GS21 (SUTURE) ×1 IMPLANT
SUTURE MNCRL 4-0 27XMF (SUTURE) ×1 IMPLANT
SYR 10ML LL (SYRINGE) ×2 IMPLANT
SYR 50ML LL SCALE MARK (SYRINGE) ×2 IMPLANT
TROCAR ENDO BLADELESS 11MM (ENDOMECHANICALS) ×2 IMPLANT
TROCAR XCEL 12X100 BLDLESS (ENDOMECHANICALS) ×2 IMPLANT
TUBING EVAC SMOKE HEATED PNEUM (TUBING) ×2 IMPLANT

## 2020-06-16 NOTE — OR Nursing (Signed)
Discussed unsigned rx for ibuprofen with Dr. Marcelline Mates via tele prior to discharge.  Patient instructed and added to d/c instructions to purchase ibuprofen over the counter.

## 2020-06-16 NOTE — Transfer of Care (Signed)
Immediate Anesthesia Transfer of Care Note  Patient: Anna Andersen  Procedure(s) Performed: XI ROBOTIC ASSISTED LAPAROSCOPIC HYSTERECTOMY AND SALPINGECTOMY (Bilateral )  Patient Location: PACU  Anesthesia Type:General  Level of Consciousness: drowsy and patient cooperative  Airway & Oxygen Therapy: Patient Spontanous Breathing and Patient connected to face mask oxygen  Post-op Assessment: Report given to RN and Post -op Vital signs reviewed and stable  Post vital signs: Reviewed and stable  Last Vitals:  Vitals Value Taken Time  BP 150/87 06/16/20 1432  Temp 36.9 C 06/16/20 1432  Pulse 66 06/16/20 1435  Resp 31 06/16/20 1435  SpO2 100 % 06/16/20 1435  Vitals shown include unvalidated device data.  Last Pain:  Vitals:   06/16/20 1023  TempSrc: Temporal  PainSc: 0-No pain         Complications: No complications documented.

## 2020-06-16 NOTE — Interval H&P Note (Signed)
History and Physical Interval Note:  06/16/2020 10:51 AM  Anna Andersen  has presented today for surgery, with the diagnosis of Postmenopausal Bleeding, Fibroid Uterus.  The various methods of treatment have been discussed with the patient and family. After consideration of risks, benefits and other options for treatment, the patient has consented to  Procedure(s): XI ROBOTIC ASSISTED LAPAROSCOPIC HYSTERECTOMY AND SALPINGECTOMY (Bilateral) as a surgical intervention.  The patient's history has been reviewed, patient examined, no change in status, stable for surgery.  I have reviewed the patient's chart and labs.  Questions were answered to the patient's satisfaction.     Rubie Maid, MD [facility]

## 2020-06-16 NOTE — Op Note (Addendum)
Procedure(s): XI ROBOTIC ASSISTED LAPAROSCOPIC HYSTERECTOMY AND SALPINGECTOMY Procedure Note  Anna Andersen female 55 y.o. 06/16/2020  Indications: The patient is a 55 y.o. [id number] female with postmenopausal bleeding, fibroid uterus. Has prior remote history of endometrial ablation. Is s/p failed medical management. She has a previous surgical history of 3 prior C-sections.    Pre-operative Diagnosis: Postmenopausal bleeding, fibroid uterus, prior abdominal surgery  Post-operative Diagnosis: Same  Surgeon: Rubie Maid, MD  Assistants:  Jeannie Fend, MD. An experienced assistant was required given the standard of surgical care given the complexity of the case.  This assistant was needed for exposure, dissection, suctioning, retraction, instrument exchange, and for overall help during the procedure.  Anesthesia: General endotracheal anesthesia  Findings: The uterus was sounded to 10 cm. Appeared normal.  Fallopian tubes previously surgically interrupted. Peritubal cyst identified on left,  ~ 3 cm, serous fluid.  Ovaries appeared normal.   Procedure Details:The patient was seen in the Holding Room. The risks, benefits, complications, treatment options, and expected outcomes were discussed with the patient.  The patient concurred with the proposed plan, giving informed consent.  The site of surgery properly noted. The patient was taken to Operating Room # 4, identified as Anna Andersen, and the procedure verified as robotic total hysterectomy with bilateral salpingectomy. A Time Out was held and the above information confirmed.  After induction of anesthesia, the patient was prepped and draped in the usual sterile manner. Pt was placed in dorsal lithotomy position after anesthesia and draped and prepped in the usual sterile manner. Foley catheter was placed.  A sterile speculum was placed into the vagina.  The cervix was grasped with a single-tooth tenaculum and the uterus was sounded to  10 cm. The small V-Care uterine manipulator was then properly placed.  A vaginal  balloon occluder a lap pad was then placed inside the vagina to help with pneumoperitoneum.    Attention was then turned to the abdomen, where  an 8 mm incision was made supraumubilcally.  The Veress needle was passed and a pneumoperitoneum was established.  The Veress needle was then removed and an 8 mm port was placed supraumbilically.  The daVinci camera was then placed supraumbilically. Three more ports were then placed. There were two 8 mm ports that were placed 10 cm laterally to the umbilicus and 2 cm inferiorly on either side.  The 12 mm assistant port was then placed in the right lower quadrant 2 cm medial and superiorr to the iliac crest. The daVinci robot was then docked in the normal fashion. The patient was placed in steep Trendelenburg positioning.  Inspection of the pelvis showed a normal uterus, ovaries, and tubes. Fallopian tubes were previously surgically interrupted.  The remaining right mesosalpinx of the fallopian tube (fimbriated end was cauterized and cut using the vessel sealer device.  The tube segment was delivered through the assistant port. The utero-ovarian ligament was also coagulated and cut. The round ligament was coagulated and cut. A bladder flap was created and the bladder was dissected down from the cervix.  The bladder flap was noted to be thickened and moderately adherent to the lower uterine segment. This entire procedure was then repeated on the left side. On the left, there was a peritubal cyst present at the fimbriated end, ~ 3 cm.  The cyst was incised to allow for removal of the tube from the port site. Serous fluid was noted.   The uterine arteries were then skeletonized, and cauterized using the vessel  sealer device.  The V-care cup was then identified and an incision was made in the cervicovaginal junction on top of the vaginal cuff. This was also repeated posteriorly. The incision  was extended laterally, freeing the uterus from the surrounding vagina.  There was bleeding noted at the right uterine artery site after extension of the incision just prior to complete colpotomy. The bleeding site was cauterized using the vessel sealer and th bipolar fenestrated forceps.  The uterus was then delivered posteriorly through the vagina using the robotic assistant.    The vaginal cuff was closed with a running suture of 0 Vicryl V-lock. The ureters were identified bilaterally. The entire pelvis was hemostatic. The right assistant site was closed with a suture of -0 Vicryl using the PMI device.  The skin of all of the incisions were closed with 4-0 Monocryl using subcuticular stitches. Each incision was injected with a total of 46 ml of Exparel solution (20 ml of 5% bupivacaine, 20 ml of saline, and 20 ml of Exparel solution). Dermabond was placed over all incisions.  The final needle, sponge, and instrument count was correct. The patient tolerated the procedure well. Patient to the recovery room in good condition.       Estimated Blood Loss:  150 ml      Drains: foley catheterization prior to procedure with  800 ml of clear urine at end of the procedure.          Total IV Fluids:  1150 ml  Specimens: Uterus with cervix, bilateral fallopian tubes (previously surgically interrupted)         Implants: None         Complications:  None; patient tolerated the procedure well.         Disposition: PACU - hemodynamically stable.         Condition: stable   Rubie Maid, MD [facility]

## 2020-06-16 NOTE — Discharge Instructions (Addendum)
Total Laparoscopic Hysterectomy, Care After The following information offers guidance on how to care for yourself after your procedure. Your health care provider may also give you more specific instructions. If you have problems or questions, contact your health care provider. What can I expect after the procedure? After the procedure, it is common to have:  Pain, bruising, and numbness around your incisions.  Tiredness (fatigue).  Poor appetite.  Less interest in sex.  Vaginal discharge or bleeding. You will need to use a sanitary pad after this procedure.  Feelings of sadness or other emotions. If your ovaries were also removed, it is also common to have symptoms of menopause, such as hot flashes, night sweats, and lack of sleep (insomnia). Follow these instructions at home: Medicines  Take over-the-counter and prescription medicines only as told by your health care provider.  Ask your health care provider if the medicine prescribed to you: ? Requires you to avoid driving or using machinery. ? Can cause constipation. You may need to take these actions to prevent or treat constipation:  Drink enough fluid to keep your urine pale yellow.  Take over-the-counter or prescription medicines.  Eat foods that are high in fiber, such as beans, whole grains, and fresh fruits and vegetables.  Limit foods that are high in fat and processed sugars, such as fried or sweet foods. Incision care  Follow instructions from your health care provider about how to take care of your incisions. Make sure you: ? Wash your hands with soap and water for at least 20 seconds before and after you change your bandage (dressing). If soap and water are not available, use hand sanitizer. ? Change your dressing as told by your health care provider. ? Leave stitches (sutures), skin glue, or adhesive strips in place. These skin closures may need to stay in place for 2 weeks or longer. If adhesive strip edges start  to loosen and curl up, you may trim the loose edges. Do not remove adhesive strips completely unless your health care provider tells you to do that.  Check your incision areas every day for signs of infection. Check for: ? More redness, swelling, or pain. ? Fluid or blood. ? Warmth. ? Pus or a bad smell.   Activity  Rest as told by your health care provider.  Avoid sitting for a long time without moving. Get up to take short walks every 1-2 hours. This is important to improve blood flow and breathing. Ask for help if you feel weak or unsteady.  Return to your normal activities as told by your health care provider. Ask your health care provider what activities are safe for you.  Do not lift anything that is heavier than 10 lb (4.5 kg), or the limit that you are told, for one month after surgery or until your health care provider says that it is safe.  If you were given a sedative during the procedure, it can affect you for several hours. Do not drive or operate machinery until your health care provider says that it is safe.   Lifestyle  Do not use any products that contain nicotine or tobacco. These products include cigarettes, chewing tobacco, and vaping devices, such as e-cigarettes. These can delay healing after surgery. If you need help quitting, ask your health care provider.  Do not drink alcohol until your health care provider approves. General instructions  Do not douche, use tampons, or have sex for at least 6 weeks, or as told by your health   care provider.  If you struggle with physical or emotional changes after your procedure, speak with your health care provider or a therapist.  Do not take baths, swim, or use a hot tub until your health care provider approves. You may only be allowed to take showers for 2-3 weeks.  Keep your dressing dry until your health care provider says it can be removed.  Try to have someone at home with you for the first 1-2 weeks to help with your  daily chores.  Wear compression stockings as told by your health care provider. These stockings help to prevent blood clots and reduce swelling in your legs.  Keep all follow-up visits. This is important.   Contact a health care provider if:  You have any of these signs of infection: ? Chills or a fever. ? More redness, swelling, or pain around an incision. ? Fluid or blood coming from an incision. ? Warmth coming from an incision. ? Pus or a bad smell coming from an incision.  An incision opens.  You feel dizzy or light-headed.  You have pain or bleeding when you urinate, or you are unable to urinate.  You have abnormal vaginal discharge.  You have pain that does not get better with medicine. Get help right away if:  You have a fever and your symptoms suddenly get worse.  You have severe abdominal pain.  You have chest pain or shortness of breath.  You faint.  You have pain, swelling, or redness in your leg.  You have heavy vaginal bleeding with blood clots, soaking through a sanitary pad in less than 1 hour. These symptoms may represent a serious problem that is an emergency. Do not wait to see if the symptoms will go away. Get medical help right away. Call your local emergency services (911 in the [place] Do not drive yourself to the hospital. Summary  After the procedure, it is common to have pain and bruising around your incisions.  Do not take baths, swim, or use a hot tub until your health care provider approves.  Do not lift anything that is heavier than 10 lb (4.5 kg), or the limit that you are told, for one month after surgery or until your health care provider says that it is safe.  Tell your health care provider if you have any signs or symptoms of infection after the procedure.  Get help right away if you have severe abdominal pain, chest pain, shortness of breath, or heavy bleeding from your vagina. This information is not intended to replace advice given  to you by your health care provider. Make sure you discuss any questions you have with your health care provider. Document Revised: 10/12/2019 Document Reviewed: 10/12/2019 Elsevier Patient Education  2021 [place].   Bupivacaine Liposomal Suspension for Injection      WEAR GREEN BRACELET FOR 3 DAYS What is this medicine? BUPIVACAINE LIPOSOMAL (bue PIV a kane LIP oh som al) is an anesthetic. It causes loss of feeling in the skin or other tissues. It is used to prevent and to treat pain from some procedures. This medicine may be used for other purposes; ask your health care provider or pharmacist if you have questions. COMMON BRAND NAME(S): EXPAREL What should I tell my health care provider before I take this medicine? They need to know if you have any of these conditions:  G6PD deficiency  heart disease  kidney disease  liver disease  low blood pressure  lung or breathing disease,  like asthma  an unusual or allergic reaction to bupivacaine, other medicines, foods, dyes, or preservatives  pregnant or trying to get pregnant  breast-feeding How should I use this medicine? This medicine is injected into the affected area. It is given by a health care provider in a hospital or clinic setting. Talk to your health care provider about the use of this medicine in children. While it may be given to children as young as 6 years for selected conditions, precautions do apply. Overdosage: If you think you have taken too much of this medicine contact a poison control center or emergency room at once. NOTE: This medicine is only for you. Do not share this medicine with others. What if I miss a dose? This does not apply. What may interact with this medicine? This medicine may interact with the following medications:  acetaminophen  certain antibiotics like dapsone, nitrofurantoin, aminosalicylic acid, sulfonamides  certain medicines for seizures like phenobarbital, phenytoin, valproic  acid  chloroquine  cyclophosphamide  flutamide  hydroxyurea  ifosfamide  metoclopramide  nitric oxide  nitroglycerin  nitroprusside  nitrous oxide  other local anesthetics like lidocaine, pramoxine, tetracaine  primaquine  quinine  rasburicase  sulfasalazine This list may not describe all possible interactions. Give your health care provider a list of all the medicines, herbs, non-prescription drugs, or dietary supplements you use. Also tell them if you smoke, drink alcohol, or use illegal drugs. Some items may interact with your medicine. What should I watch for while using this medicine? Your condition will be monitored carefully while you are receiving this medicine. Be careful to avoid injury while the area is numb, and you are not aware of pain. What side effects may I notice from receiving this medicine? Side effects that you should report to your doctor or health care professional as soon as possible:  allergic reactions like skin rash, itching or hives, swelling of the face, lips, or tongue  seizures  signs and symptoms of a dangerous change in heartbeat or heart rhythm like chest pain; dizziness; fast, irregular heartbeat; palpitations; feeling faint or lightheaded; falls; breathing problems  signs and symptoms of methemoglobinemia such as pale, gray, or blue colored skin; headache; fast heartbeat; shortness of breath; feeling faint or lightheaded, falls; tiredness Side effects that usually do not require medical attention (report to your doctor or health care professional if they continue or are bothersome):  anxious  back pain  changes in taste  changes in vision  constipation  dizziness  fever  nausea, vomiting This list may not describe all possible side effects. Call your doctor for medical advice about side effects. You may report side effects to FDA at 1-800-FDA-1088. Where should I keep my medicine? This drug is given in a hospital or  clinic and will not be stored at home. NOTE: This sheet is a summary. It may not cover all possible information. If you have questions about this medicine, talk to your doctor, pharmacist, or health care provider.  2021 Elsevier/Gold Standard (2019-05-17 12:24:57)   AMBULATORY SURGERY  DISCHARGE INSTRUCTIONS   1) The drugs that you were given will stay in your system until tomorrow so for the next 24 hours you should not:  A) Drive an automobile B) Make any legal decisions C) Drink any alcoholic beverage   2) You may resume regular meals tomorrow.  Today it is better to start with liquids and gradually work up to solid foods.  You may eat anything you prefer, but it is  better to start with liquids, then soup and crackers, and gradually work up to solid foods.   3) Please notify your doctor immediately if you have any unusual bleeding, trouble breathing, redness and pain at the surgery site, drainage, fever, or pain not relieved by medication.    4) Additional Instructions:        Please contact your physician with any problems or Same Day Surgery at [phone], Monday through Friday 6 am to 4 pm, or [place] at [facility] number at [phone].

## 2020-06-16 NOTE — Anesthesia Preprocedure Evaluation (Signed)
Anesthesia Evaluation  Patient identified by MRN, date of birth, ID band Patient awake    Reviewed: Allergy & Precautions, NPO status , Unable to perform ROS - Chart review only  History of Anesthesia Complications Negative for: history of anesthetic complications  Airway Mallampati: II       Dental   Pulmonary neg sleep apnea, neg COPD, Not current smoker,           Cardiovascular hypertension, Pt. on medications + Past MI  (-) CHF (-) dysrhythmias (-) Valvular Problems/Murmurs     Neuro/Psych neg Seizures Anxiety Depression    GI/Hepatic Neg liver ROS, neg GERD  ,  Endo/Other  neg diabetes  Renal/GU negative Renal ROS     Musculoskeletal   Abdominal   Peds  Hematology   Anesthesia Other Findings   Reproductive/Obstetrics                             Anesthesia Physical Anesthesia Plan  ASA: II  Anesthesia Plan: General   Post-op Pain Management:    Induction: Intravenous  PONV Risk Score and Plan: 3 and Ondansetron, Dexamethasone and Treatment may vary due to age or medical condition  Airway Management Planned: Oral ETT  Additional Equipment:   Intra-op Plan:   Post-operative Plan:   Informed Consent: I have reviewed the patients History and Physical, chart, labs and discussed the procedure including the risks, benefits and alternatives for the proposed anesthesia with the patient or authorized representative who has indicated his/her understanding and acceptance.       Plan Discussed with:   Anesthesia Plan Comments:         Anesthesia Quick Evaluation

## 2020-06-16 NOTE — Anesthesia Procedure Notes (Signed)
Procedure Name: Intubation Date/Time: 06/16/2020 11:33 AM Performed by: Joellyn Quails, RN Pre-anesthesia Checklist: Patient identified, Patient being monitored, Timeout performed, Emergency Drugs available and Suction available Patient Re-evaluated:Patient Re-evaluated prior to induction Oxygen Delivery Method: Circle system utilized Preoxygenation: Pre-oxygenation with 100% oxygen Induction Type: IV induction Ventilation: Mask ventilation without difficulty Laryngoscope Size: 3 and McGraph Grade View: Grade I Tube type: Oral Tube size: 7.0 mm Number of attempts: 1 Airway Equipment and Method: Stylet Placement Confirmation: ETT inserted through vocal cords under direct vision,  positive ETCO2 and breath sounds checked- equal and bilateral Secured at: 21 cm Tube secured with: Tape Dental Injury: Teeth and Oropharynx as per pre-operative assessment

## 2020-06-17 ENCOUNTER — Encounter: Payer: Self-pay | Admitting: Obstetrics and Gynecology

## 2020-06-17 NOTE — Anesthesia Postprocedure Evaluation (Signed)
Anesthesia Post Note  Patient: Anna Andersen  Procedure(s) Performed: XI ROBOTIC ASSISTED LAPAROSCOPIC HYSTERECTOMY AND SALPINGECTOMY (Bilateral )  Patient location during evaluation: PACU Anesthesia Type: General Level of consciousness: awake and alert Pain management: pain level controlled Vital Signs Assessment: post-procedure vital signs reviewed and stable Respiratory status: spontaneous breathing and respiratory function stable Cardiovascular status: stable Anesthetic complications: no   No complications documented.   Last Vitals:  Vitals:   06/16/20 1715 06/16/20 1730  BP: 123/73 119/69  Pulse: 79 73  Resp: 12 18  Temp:    SpO2: 100% 100%    Last Pain:  Vitals:   06/17/20 0838  TempSrc:   PainSc: 0-No pain                 Herchel Hopkin K

## 2020-06-18 LAB — SURGICAL PATHOLOGY

## 2020-06-30 ENCOUNTER — Telehealth: Payer: Self-pay | Admitting: Obstetrics and Gynecology

## 2020-06-30 NOTE — Telephone Encounter (Signed)
Pt called back stating that she think that she is better now. Pt stated that it was light bleeding only when she wiped. Pt stated that the vaginal odor was not fishy or had a foul odor just like a strong bleed smell. Pt was advised that if she noticed heavy bleeding with clots or fishy vaginal odor to please contact the office. Pt voiced that she understood.

## 2020-06-30 NOTE — Telephone Encounter (Signed)
Pt called no answer LM via VM to please contact the office to speak more about her call to the office and schedule an appt with [facility].

## 2020-06-30 NOTE — Telephone Encounter (Signed)
Ruthellen called in and states some time last week about Wednesday or Thursday, she started spotting and it is bright red with odor.  Julie-Anne states it is not heavy and she only notices it when she wipes.  Parris states she is still feeling pain on her right side and she is feeling pressure in her vagina.  Patient denies any fever or shortness of breath. Patient wants to know if she should be seen sooner than 5/13?  Please advise.

## 2020-07-04 ENCOUNTER — Other Ambulatory Visit: Payer: Self-pay

## 2020-07-04 ENCOUNTER — Encounter: Payer: Self-pay | Admitting: Obstetrics and Gynecology

## 2020-07-04 ENCOUNTER — Ambulatory Visit (INDEPENDENT_AMBULATORY_CARE_PROVIDER_SITE_OTHER): Payer: Managed Care, Other (non HMO) | Admitting: Obstetrics and Gynecology

## 2020-07-04 VITALS — BP 143/90 | HR 111 | Ht 61.0 in | Wt 139.3 lb

## 2020-07-04 DIAGNOSIS — R399 Unspecified symptoms and signs involving the genitourinary system: Secondary | ICD-10-CM | POA: Diagnosis not present

## 2020-07-04 DIAGNOSIS — Z90710 Acquired absence of both cervix and uterus: Secondary | ICD-10-CM

## 2020-07-04 DIAGNOSIS — Z09 Encounter for follow-up examination after completed treatment for conditions other than malignant neoplasm: Secondary | ICD-10-CM

## 2020-07-04 DIAGNOSIS — N950 Postmenopausal bleeding: Secondary | ICD-10-CM

## 2020-07-04 LAB — POCT URINALYSIS DIPSTICK
Bilirubin, UA: NEGATIVE
Glucose, UA: NEGATIVE
Ketones, UA: NEGATIVE
Leukocytes, UA: NEGATIVE
Nitrite, UA: NEGATIVE
Protein, UA: NEGATIVE
Spec Grav, UA: >=1.03 — AB (ref 1.010–1.025)
Urobilinogen, UA: 0.2 E.U./dL
pH, UA: 6 (ref 5.0–8.0)

## 2020-07-04 NOTE — Progress Notes (Signed)
Pt present for post op follow up after a hysterectomy. Pt stated having freq urination and pressure with urination. UA completed and documented. Pt stated that her incisions are healing well.

## 2020-07-04 NOTE — Progress Notes (Signed)
    OBSTETRICS/GYNECOLOGY POST-OPERATIVE CLINIC VISIT  Subjective:     Anna Andersen is a 55 y.o. female who presents to the clinic 2 weeks status post robotic total hysterectomy with bilateral salpingectomy for persistent postmenopausal bleeding. Eating a regular diet without difficulty. Bowel movements are normal. Reports pressure with urination and frequency.  Does admit that she is not drinking enough water.  The patient is not having any pain. She does report that she is having some hot flushes and night sweats at night, but is intermittent.   The following portions of the patient's history were reviewed and updated as appropriate: allergies, current medications, past family history, past medical history, past social history, past surgical history and problem list.  Review of Systems Pertinent items noted in HPI and remainder of comprehensive ROS otherwise negative.    Objective:    BP (!) 143/90   Pulse (!) 111   Ht 5\' 1"  (1.549 m)   Wt 139 lb 4.8 oz (63.2 kg)   LMP 12/20/2019 (Exact Date)   BMI 26.32 kg/m  General:  alert and no distress  Abdomen: soft, bowel sounds active, non-tender  Incision:   healing well, no drainage, no erythema, no hernia, no seroma, no swelling, no dehiscence, incision well approximated    Pathology:   A. FALLOPIAN TUBE SEGMENT, RIGHT; SALPINGECTOMY:  - FALLOPIAN TUBE WITH FIMBRIATED END.  - SMALL BENIGN PARATUBAL CYST.  - NEGATIVE FOR ATYPIA AND MALIGNANCY.   B. FALLOPIAN TUBE SEGMENT, LEFT; SALPINGECTOMY:  - FALLOPIAN TUBE WITH FIMBRIATED END AND ATTACHED, COLLAPSED BENIGN  OVARIAN SEROUS CYSTADENOMA.  - NEGATIVE FOR ATYPIA AND MALIGNANCY.   C. UTERUS WITH CERVIX; TOTAL HYSTERECTOMY:  - CERVIX WITH NABOTHIAN CYSTS AND CHRONIC CERVICITIS WITH SQUAMOUS  METAPLASIA.  - WEAKLY PROLIFERATIVE ENDOMETRIUM.  - MYOMETRIUM WITH ADENOMYOSIS.  - BILATERAL PROXIMAL PORTIONS OF FALLOPIAN TUBE.  - NEGATIVE FOR ATYPIA AND MALIGNANCY.    Assessment:   S/p laparoscopic (robotic) total hysterectomy with bilateral salpingectomy Vasomotor symptoms UTI symptoms.   Plan:   1. Doing well postoperatively. 2. Wound care discussed. 3. Operative findings reviewed. Pathology report discussed.  4. 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  5. Anticipated return to work: now. 6.  Discussed that vasomotor symptoms may be transient, or could be more long-lasting. Advised on herbal remedies for now, if more persistent or bothersome by final post-op check, can discuss HRT if desired.  7. UTI symptoms, UA normal today except small amount of blood. Patient notes she is still having some occasional vaginal spotting.  Advised on increasing hydration and use of Azo.  8. Follow up: 4 weeks for final post-op check.   Rubie Maid, MD [facility]

## 2020-07-12 ENCOUNTER — Other Ambulatory Visit: Payer: Self-pay | Admitting: Certified Nurse Midwife

## 2020-07-17 ENCOUNTER — Other Ambulatory Visit: Payer: Self-pay | Admitting: Internal Medicine

## 2020-07-17 DIAGNOSIS — Z1231 Encounter for screening mammogram for malignant neoplasm of breast: Secondary | ICD-10-CM

## 2020-08-01 ENCOUNTER — Encounter: Payer: Self-pay | Admitting: Obstetrics and Gynecology

## 2020-08-01 ENCOUNTER — Ambulatory Visit (INDEPENDENT_AMBULATORY_CARE_PROVIDER_SITE_OTHER): Payer: Managed Care, Other (non HMO) | Admitting: Obstetrics and Gynecology

## 2020-08-01 ENCOUNTER — Other Ambulatory Visit: Payer: Self-pay

## 2020-08-01 VITALS — BP 135/83 | HR 76 | Ht 61.0 in | Wt 137.5 lb

## 2020-08-01 DIAGNOSIS — Z09 Encounter for follow-up examination after completed treatment for conditions other than malignant neoplasm: Secondary | ICD-10-CM

## 2020-08-01 DIAGNOSIS — N8003 Adenomyosis of the uterus: Secondary | ICD-10-CM

## 2020-08-01 DIAGNOSIS — N951 Menopausal and female climacteric states: Secondary | ICD-10-CM

## 2020-08-01 DIAGNOSIS — Z90710 Acquired absence of both cervix and uterus: Secondary | ICD-10-CM

## 2020-08-01 HISTORY — DX: Adenomyosis of the uterus: N80.03

## 2020-08-01 NOTE — Progress Notes (Signed)
Pt present for post op visit after hysterectomy. Pt stated that she was doing well. Work release completed.

## 2020-08-01 NOTE — Progress Notes (Signed)
    OBSTETRICS/GYNECOLOGY POST-OPERATIVE CLINIC VISIT  Subjective:     Anna Andersen is a 55 y.o. female who presents to the clinic 6 weeks status post robotic total hysterectomy with bilateral salpingectomy for persistent  postmenopausal bleeding . Eating a regular diet without difficulty. Bowel movements are normal.  The patient is not having any pain. She does report that she is still having some hot flushes and night sweats at night, but is intermittent. Purchased Estroven over the counter earlier this week.    The following portions of the patient's history were reviewed and updated as appropriate: allergies, current medications, past family history, past medical history, past social history, past surgical history and problem list.  Review of Systems Pertinent items noted in HPI and remainder of comprehensive ROS otherwise negative.    Objective:    BP 135/83   Pulse 76   Ht 5\' 1"  (1.549 m)   Wt 137 lb 8 oz (62.4 kg)   LMP 12/20/2019 (Exact Date)   BMI 25.98 kg/m  General:  alert and no distress  Abdomen: soft, bowel sounds active, non-tender  Incision:   healing well, no drainage, no erythema, no hernia, no seroma, no swelling, no dehiscence, incision well approximated  Pelvis:      Pathology:   A.  FALLOPIAN TUBE SEGMENT, RIGHT; SALPINGECTOMY:  - FALLOPIAN TUBE WITH FIMBRIATED END.  - SMALL BENIGN PARATUBAL CYST.  - NEGATIVE FOR ATYPIA AND MALIGNANCY.   B.  FALLOPIAN TUBE SEGMENT, LEFT; SALPINGECTOMY:  - FALLOPIAN TUBE WITH FIMBRIATED END AND ATTACHED, COLLAPSED BENIGN  OVARIAN SEROUS CYSTADENOMA.  - NEGATIVE FOR ATYPIA AND MALIGNANCY.   C.  UTERUS WITH CERVIX; TOTAL HYSTERECTOMY:  - CERVIX WITH NABOTHIAN CYSTS AND CHRONIC CERVICITIS WITH SQUAMOUS  METAPLASIA.  - WEAKLY PROLIFERATIVE ENDOMETRIUM.  - MYOMETRIUM WITH ADENOMYOSIS.  - BILATERAL PROXIMAL PORTIONS OF FALLOPIAN TUBE.  - NEGATIVE FOR ATYPIA AND MALIGNANCY.   Assessment:   S/p laparoscopic  (robotic) total hysterectomy with bilateral salpingectomy Vasomotor symptoms  Plan:   1. Doing well postoperatively.  Previously discussed pathology report at last visit.  2. Activity restrictions:  2 additional weeks no intercourse.  3. Anticipated return to work: now. Work notice given.  4.  Currently still having some mild vasomotor symptoms, rently started [employer].  If no relief, advised that she can return and we can discuss HRT if desired.  5. Follow up: 3-4  months  for final annual exam.   Rubie Maid, MD [facility]

## 2020-09-01 ENCOUNTER — Encounter: Payer: Managed Care, Other (non HMO) | Admitting: Certified Nurse Midwife

## 2020-11-04 ENCOUNTER — Other Ambulatory Visit: Payer: Self-pay

## 2020-11-04 ENCOUNTER — Encounter: Payer: Managed Care, Other (non HMO) | Admitting: Obstetrics and Gynecology

## 2020-11-11 ENCOUNTER — Encounter: Payer: Self-pay | Admitting: Obstetrics and Gynecology

## 2020-11-11 ENCOUNTER — Other Ambulatory Visit: Payer: Self-pay

## 2020-11-11 ENCOUNTER — Ambulatory Visit (INDEPENDENT_AMBULATORY_CARE_PROVIDER_SITE_OTHER): Payer: Managed Care, Other (non HMO) | Admitting: Obstetrics and Gynecology

## 2020-11-11 VITALS — BP 127/76 | HR 70 | Ht 61.0 in | Wt 132.8 lb

## 2020-11-11 DIAGNOSIS — R399 Unspecified symptoms and signs involving the genitourinary system: Secondary | ICD-10-CM

## 2020-11-11 DIAGNOSIS — N941 Unspecified dyspareunia: Secondary | ICD-10-CM

## 2020-11-11 DIAGNOSIS — N950 Postmenopausal bleeding: Secondary | ICD-10-CM | POA: Diagnosis not present

## 2020-11-11 LAB — POCT URINALYSIS DIPSTICK
Bilirubin, UA: NEGATIVE
Glucose, UA: NEGATIVE
Ketones, UA: NEGATIVE
Leukocytes, UA: NEGATIVE
Nitrite, UA: NEGATIVE
Protein, UA: NEGATIVE
Spec Grav, UA: 1.02 (ref 1.010–1.025)
Urobilinogen, UA: 0.2 E.U./dL
pH, UA: 6 (ref 5.0–8.0)

## 2020-11-11 MED ORDER — METRONIDAZOLE 0.75 % VA GEL: 1.0000 | Freq: Every day | VAGINAL | 0 refills | Status: DC

## 2020-11-11 MED ORDER — AMOXICILLIN-POT CLAVULANATE 875-125 MG PO TABS: 1.0000 | ORAL_TABLET | Freq: Two times a day (BID) | ORAL | 0 refills | Status: DC

## 2020-11-11 NOTE — Progress Notes (Signed)
    GYNECOLOGY PROGRESS NOTE  Subjective:    Patient ID: Anna Andersen, female    DOB: Jul 03, 1965, 55 y.o.   MRN: [id number]  HPI  Patient is a 55 y.o. [id number] female who presents for an episode of postmenopausal bleeding. She has had a hysterectomy (robotic total hysterectomy in April 2022). Patient stated noticing blood when she wiped after urinating and bowel movement for two weeks.  Patient also stated having pain with sex, lower abd pain and pain with urination. Has begun taking a leftover prescription of Amoxicilin (4 pills) which has helped a little.   Also notes that over the past several weeks she has been experiencing hot flushes and sweats.  Sometimes gets a "sick feeling".  Has been screened for COVID recently, was negative.  Is taking OTC hormonal remedy Hoy Register) and another natural supplement.    The following portions of the patient's history were reviewed and updated as appropriate: allergies, current medications, past family history, past medical history, past social history, past surgical history, and problem list.  Review of Systems Pertinent items noted in HPI and remainder of comprehensive ROS otherwise negative.   Objective:   Blood pressure 127/76, pulse 70, height 5\' 1"  (1.549 m), weight 132 lb 12.8 oz (60.2 kg), last menstrual period 12/20/2019. Body mass index is 25.09 kg/m. General appearance: alert, cooperative, appears stated age, and no distress Abdomen: soft, non-tender; bowel sounds normal; no masses,  no organomegaly Back: symmetric, no curvature. ROM normal. No CVA tenderness.  Pelvic: external genitalia normal, rectovaginal septum normal.  Vagina without discharge, no blood in vault. Minimal vaginal atrophy present. Uterus and cervix surgically absent. Adnexae non-palpable, nontender bilaterally.  Extremities: extremities normal, atraumatic, no cyanosis or edema Neurologic: Grossly normal   Assessment:   1. PMB (postmenopausal bleeding)   2. UTI  symptoms   3. Dyspareunia in female     Plan:   PMB (postmenopausal bleeding) - Unclear cause, but no vaginal blood noted on exam today, no discharge present. May be urinary as trace blood noted on UA today.  Also, could be due to mild atrophy noted on exam today. Given samples of lubricants and vaginal moisturizers. If symptoms  UTI symptoms - Urine culture ordered. Patient has partially self-treated with leftover Amoxicillin. Will send in prescription for Augmentin. Patient also desires something to treat BV infection as she usually gets these after taking antibiotics. Will send in prescription for Metrogel.   Dyspareunia in female  - Unsure if related to possible UTI or to mild vaginal atrophy. Given samples of lubricants and vaginal moisturizers. If symptoms persist, can consider prescription HRT that can help manage this as well as her worsening hot flushes/night sweats.   To follow up if symptoms persist or worsen.    Rubie Maid, MD [facility]

## 2021-06-21 ENCOUNTER — Other Ambulatory Visit: Payer: Self-pay

## 2021-06-21 ENCOUNTER — Emergency Department: Payer: Managed Care, Other (non HMO)

## 2021-06-21 ENCOUNTER — Encounter: Payer: Self-pay | Admitting: Emergency Medicine

## 2021-06-21 ENCOUNTER — Emergency Department
Admission: EM | Admit: 2021-06-21 | Discharge: 2021-06-21 | Disposition: A | Discharge status: Home / Self Care | Payer: Managed Care, Other (non HMO) | Attending: Emergency Medicine | Admitting: Emergency Medicine

## 2021-06-21 DIAGNOSIS — M542 Cervicalgia: Secondary | ICD-10-CM | POA: Diagnosis present

## 2021-06-21 DIAGNOSIS — M5412 Radiculopathy, cervical region: Secondary | ICD-10-CM | POA: Diagnosis not present

## 2021-06-21 MED ORDER — CYCLOBENZAPRINE HCL 10 MG PO TABS
5.0000 mg | ORAL_TABLET | Freq: Once | ORAL | Status: AC
  Administered 2021-06-21: 5 mg via ORAL
  Filled 2021-06-21: qty 1

## 2021-06-21 MED ORDER — HYDROCODONE-ACETAMINOPHEN 5-325 MG PO TABS
1.0000 | ORAL_TABLET | Freq: Once | ORAL | Status: AC
  Administered 2021-06-21: 1 via ORAL
  Filled 2021-06-21: qty 1

## 2021-06-21 MED ORDER — KETOROLAC TROMETHAMINE 30 MG/ML IJ SOLN
30.0000 mg | Freq: Once | INTRAMUSCULAR | Status: AC
  Administered 2021-06-21: 30 mg via INTRAMUSCULAR
  Filled 2021-06-21: qty 1

## 2021-06-21 MED ORDER — TRAMADOL HCL 50 MG PO TABS
50.0000 mg | ORAL_TABLET | Freq: Once | ORAL | Status: AC
  Administered 2021-06-21: 50 mg via ORAL
  Filled 2021-06-21: qty 1

## 2021-06-21 MED ORDER — PREDNISONE 10 MG PO TABS: ORAL_TABLET | ORAL | 0 refills | Status: DC

## 2021-06-21 MED ORDER — CYCLOBENZAPRINE HCL 5 MG PO TABS: 5.0000 mg | ORAL_TABLET | Freq: Three times a day (TID) | ORAL | 0 refills | Status: DC | PRN

## 2021-06-21 NOTE — ED Triage Notes (Signed)
Pt here with right shoulder pain that has gotten worse over the past few days. Pt states pain now is shooting down her right arm. ?

## 2021-06-21 NOTE — ED Notes (Signed)
See triage note  presents with posterior right shoulder pain   states pain started couple of days ago  denies any injury    pt is pacing in room   ?

## 2021-06-21 NOTE — ED Provider Notes (Signed)
? ?[facility] ?Provider Note ? ? ? Event Date/Time  ? First MD Initiated Contact with Patient 06/21/21 1107   ?  (approximate) ? ? ?History  ? ?Shoulder Pain ? ? ?HPI ? ?MERSADES BARBARO is a 56 y.o. female presents to the ER today with complaint of neck and right shoulder pain.  This started a few days ago but worsened overnight.  She describes the pain as sharp, shooting and burning.  The pain radiates down her right arm.  She reports associated numbness and tingling but denies weakness.  She denies any injury to the area but thought she may have slept wrong a few days ago.  She denies any prior neck or shoulder problems.  She has tried Tylenol and Ibuprofen OTC without any relief of symptoms. ? ? ? ?Physical Exam  ? ?Triage Vital Signs: ?ED Triage Vitals  ?Enc Vitals Group  ?   BP 06/21/21 1102 130/72  ?   Pulse Rate 06/21/21 1102 70  ?   Resp 06/21/21 1102 20  ?   Temp 06/21/21 1102 97.6 ?F (36.4 ?C)  ?   Temp Source 06/21/21 1102 Oral  ?   SpO2 06/21/21 1102 100 %  ?   Weight 06/21/21 1103 132 lb (59.9 kg)  ?   Height 06/21/21 1103 '5\' 1"'$  (1.549 m)  ?   Head Circumference --   ?   Peak Flow --   ?   Pain Score 06/21/21 1103 10  ?   Pain Loc --   ?   Pain Edu? --   ?   Excl. in [place]? --   ? ? ?Most recent vital signs: ?Vitals:  ? 06/21/21 1102  ?BP: 130/72  ?Pulse: 70  ?Resp: 20  ?Temp: 97.6 ?F (36.4 ?C)  ?SpO2: 100%  ? ? ? ?General: Awake, appears uncomfortable but in NAD. ?CV:  RRR.  Radial pulse 2+ on the right. ?Resp:  CTA bilaterally. ?MSK:  Decreased flexion, extension, rotation and lateral bending of the cervical spine secondary to pain.  No bony tenderness noted over the cervical spine.  Pain with palpation of the right paracervical muscles.  Shoulder shrug equal.  Decreased external rotation of the right shoulder.  Normal internal rotation of the right shoulder.  No pain with palpation of the shoulder.  Strength 5/5 BUE.  Handgrips equal. ?Neuro:  Alert and oriented.  Coordination  normal. ? ? ?ED Results / Procedures / Treatments  ? ? ?RADIOLOGY ? ?Imaging Orders    ?     DG Cervical Spine 2-3 Views    ?     DG Shoulder Right    ?IMPRESSION: Degenerative disc disease at C5-6. No acute bony findings. IMPRESSION: Negative.  ? ? ? ?MEDICATIONS ORDERED IN ED: ?Medications  ?ketorolac (TORADOL) 30 MG/ML injection 30 mg (30 mg Intramuscular Given 06/21/21 1151)  ?cyclobenzaprine (FLEXERIL) tablet 5 mg (5 mg Oral Given 06/21/21 1150)  ?traMADol (ULTRAM) tablet 50 mg (50 mg Oral Given 06/21/21 1149)  ?HYDROcodone-acetaminophen (NORCO/VICODIN) 5-325 MG per tablet 1 tablet (1 tablet Oral Given 06/21/21 1302)  ? ? ? ?IMPRESSION / MDM / ASSESSMENT AND PLAN / ED COURSE  ?I reviewed the triage vital signs and the nursing notes. ? ?Right-sided neck pain, right shoulder pain, paresthesia of right upper extremity: ? ?Differential diagnosis includes, but is not limited to, cervical strain, cervical radiculitis, shoulder impingement ? ?Xray cervical spine shows degenerative changes at C5-C6 without to space narrowing or bulging per my read,  confirmed by radiology ?Xray right shoulder does not show any evidence of acute fracture or degenerative changes per my read, confirmed by radiology ?Torodol 30 mg IM x 1 ?Flexeril 5 mg PO x 1  ?Tramadol 50 mg PO x 1 ?After reviewing the imaging with the patient she reports her pain is only slightly improved and requesting further pain medication at this time ?Hydrocodone 5-325 mg p.o. x1-PDMP reviewed.  She was given Percocet 5-3 25 #24 06/16/20 ?She does report improvement in pain with the Hydrocodone.  Discussed that based off her imaging studies that she likely has cervical radiculitis secondary to degeneration from C5-C6.  She is agreeable to discharge and will plan to follow-up with neurosurgery as an outpatient. ?Rx for Pred taper x6 days ?Rx for Flexeril 5 mg every 8 hours as needed-sedation caution given ?Neck exercises given ? ? ?FINAL CLINICAL IMPRESSION(S) / ED  DIAGNOSES  ? ?Final diagnoses:  ?Cervical radiculitis  ? ? ? ?Rx / DC Orders  ? ?ED Discharge Orders   ? ?      Ordered  ?  predniSONE (DELTASONE) 10 MG tablet       ? 06/21/21 1308  ?  cyclobenzaprine (FLEXERIL) 5 MG tablet  3 times daily PRN       ? 06/21/21 1308  ? ?  ?  ? ?  ? ? ? ?Note:  This document was prepared using Dragon voice recognition software and may include unintentional dictation errors. ? ?  ?Jearld Fenton, NP ?06/21/21 1309 ? ?  ?Lucrezia Starch, MD ?06/21/21 1622 ? ?

## 2021-06-21 NOTE — Discharge Instructions (Signed)
You were seen today for neck and shoulder pain.  Your x-ray did show some degenerative changes at C5-C6 which could be causing a pinched nerve in your neck causing the pain in your right arm.  I am sending you home with a prescription for steroids and muscle relaxers.  Please be aware that the muscle relaxers may cause sedation.  Please call neurosurgery and follow-up if your symptoms persist or worsen. ?

## 2021-08-17 ENCOUNTER — Other Ambulatory Visit: Payer: Self-pay

## 2021-08-17 ENCOUNTER — Telehealth: Payer: Self-pay

## 2021-08-17 DIAGNOSIS — N951 Menopausal and female climacteric states: Secondary | ICD-10-CM

## 2021-08-17 MED ORDER — METRONIDAZOLE 500 MG PO TABS: 500.0000 mg | ORAL_TABLET | Freq: Two times a day (BID) | ORAL | 0 refills | Status: AC

## 2021-08-17 NOTE — Telephone Encounter (Signed)
Patient called for medication for bacterial vaginosis. She complains of odor and discharge. Tried treating symptoms with monistat and no relief. I sent her a prescription for metronidazole to her pharmacy. She also has some concerns about her hormones. She would like to schedule an appointment to talk about her menopausal symptoms. She is taking hormone supplements otc. She wants levels checked and wants to know if she should stop taking the supplements prior to having her levels checked. Will the supplement alter the results. Please advise on which labs to order and if she needs to stop her supplements first. I will call patient back and let her know after I hear from you.  Thanks CB

## 2021-08-19 NOTE — Addendum Note (Signed)
Addended by: Chilton Greathouse on: 08/19/2021 11:50 AM   Modules accepted: Orders

## 2021-08-19 NOTE — Addendum Note (Signed)
Addended by: Chilton Greathouse on: 08/19/2021 02:22 PM   Modules accepted: Orders

## 2021-08-19 NOTE — Telephone Encounter (Signed)
Called patient to let her know that lab orders have been placed. She will call back and schedule lab only appointment and follow up 1 week after labs are drawn to discuss hormones.

## 2021-08-31 ENCOUNTER — Telehealth: Payer: Self-pay

## 2021-08-31 DIAGNOSIS — M5412 Radiculopathy, cervical region: Secondary | ICD-10-CM

## 2021-08-31 NOTE — Telephone Encounter (Signed)
-----   Message from Peggyann Shoals sent at 08/31/2021 11:42 AM EDT ----- Regarding: pt not better Contact: [phone] Patient seen on 07/24/2021, she is calling that she only went to 2 PT visits and did  not go back because she felt it was making her symptoms worse. Can you refer her for injections? She is scheduled for a telephone visit in 7/28.

## 2021-09-03 ENCOUNTER — Other Ambulatory Visit: Payer: Managed Care, Other (non HMO)

## 2021-09-03 DIAGNOSIS — N951 Menopausal and female climacteric states: Secondary | ICD-10-CM

## 2021-09-04 LAB — ESTRADIOL: Estradiol: 7.9 pg/mL

## 2021-09-04 LAB — PROGESTERONE: Progesterone: 0.1 ng/mL

## 2021-09-04 LAB — FSH/LH
FSH: 88.9 m[IU]/mL
LH: 22 m[IU]/mL

## 2021-09-07 NOTE — Telephone Encounter (Signed)
Patient is calling back. PT is not working what is the next step?

## 2021-09-09 NOTE — Telephone Encounter (Signed)
Patient said that she agrees to referral for injections. I do not see one in the [facility] or [facility] epic. Can you place the referral to [facility] physiatry?

## 2021-09-10 NOTE — Telephone Encounter (Signed)
Please let the patient know that I have placed the referral.

## 2021-09-14 NOTE — Telephone Encounter (Signed)
Referral faxed on 09/11/2021.

## 2021-09-18 ENCOUNTER — Ambulatory Visit (INDEPENDENT_AMBULATORY_CARE_PROVIDER_SITE_OTHER): Payer: Managed Care, Other (non HMO) | Admitting: Neurosurgery

## 2021-09-18 DIAGNOSIS — M5412 Radiculopathy, cervical region: Secondary | ICD-10-CM

## 2021-09-18 NOTE — Progress Notes (Signed)
Neurosurgery Telephone (Audio-Only) Note  Requesting Provider     Idelle Crouch, MD [address] [facility] [place],  [place] [address] T: [phone] F: [phone]  Primary Care Provider Idelle Crouch, MD [place] [address] T: [phone] F: [phone]  Telehealth visit was conducted with Raechel Chute, a 56 y.o. female via telephone.  Patient location: home  Provider location: Office  History of Present Illness: Ms. Hinde is a 56 y.o presenting today for a telephone visit to review her progress with PT. She was only able to participate in one session of PT and states the therapist told her that he did not think that physical therapy would be beneficial and so she has not gone back.  Despite time and this one session of physical therapy she continues to have neck and radiating right arm pain that are not improved since her last visit.  LOV 07/24/21 07/24/2021 Ms. Fenna Semel is a 56 y.o with a history of anxiety, depression, and hypertension who is here today with a chief complaint of neck pain.  She was seen in the ER on 06/21/21 with complains of sharp, shooting, burning pain into her neck and right shoulder for several days that had worsened overnight. At that time she had tried Tylenol and ibuprofen over-the-counter without any significant symptomatic relief. She was given prescriptions for Flexeril and prednisone.  She states that since her ER visit her symptoms have improved some but she continues to have pain in her right scapula that radiates down her arm with associated numbness and tingling into her right middle finger. She denies any similar left-sided symptoms. She states that this sensation feels as though she is hit her funny bone and does occasionally make her feel as though she is going to drop objects. She was referred to physical therapy by her primary care provider but was holding off on this  due to wanting to have an MRI and see neurosurgery first. The patient did undergo an MRI in the middle of last month but unfortunately our office was not made aware and we do not have this imaging to review.  Conservative measures:  Physical therapy: none  Multimodal medical therapy including regular antiinflammatories: Tylenol, ibuprofen, Flexeril, gabapentin, and prednisone Injections: no epidural steroid injections  General Review of Systems:  A ROS was performed including pertinent positive and negatives as documented.  All other systems are negative.   Prior to Admission medications   Medication Sig Start Date End Date Taking? Authorizing Provider  ALPRAZolam (XANAX) 0.25 MG tablet Take 0.25 mg by mouth 2 (two) times daily as needed for anxiety. 04/04/20   [provider]  cyclobenzaprine (FLEXERIL) 5 MG tablet Take 1 tablet (5 mg total) by mouth 3 (three) times daily as needed for muscle spasms. 06/21/21   Jearld Fenton, NP  Nutritional Supplements (ESTROVEN PO) Take by mouth daily. Take one tablet daily    [provider]  Nutritional Supplements (MENOPAUSE FORMULA PO) Take by mouth daily. Nested Menopause Complete herbal care  Take one tablet daily    [provider]  predniSONE (DELTASONE) 10 MG tablet Take 6 tabs on day 1, 5 tabs on day 2, 4 tabs on day 3, 3 tabs on day 4, 2 tabs on day 5, 1 tab on day 6 06/21/21   Jearld Fenton, NP  propranolol (INDERAL) 80 MG tablet Take 80 mg by mouth 2 (two) times daily.  [provider]     DATA REVIEWED    Imaging Studies  No interval imaging to review  Laboratory Studies   Recent Results (from the past 2160 hour(s))  FSH/LH     Status: None   Collection Time: 09/03/21  2:03 PM  Result Value Ref Range   LH 22.0 mIU/mL    Comment:                     Adult Female:                       Follicular phase      2.4 -  12.6                       Ovulation phase      14.0 -  95.6                        Luteal phase          1.0 -  11.4                       Postmenopausal        7.7 -  58.5    FSH 88.9 mIU/mL    Comment:                     Adult Female:                       Follicular phase      3.5 -  12.5                       Ovulation phase       4.7 -  21.5                       Luteal phase          1.7 -   7.7                       Postmenopausal       25.8 - 134.8   Progesterone     Status: None   Collection Time: 09/03/21  2:03 PM  Result Value Ref Range   Progesterone 0.1 ng/mL    Comment:                      Follicular phase       0.1 -   0.9                      Luteal phase           1.8 -  23.9                      Ovulation phase        0.1 -  12.0                      Pregnant                         First trimester    11.0 -  44.3  Second trimester   25.4 -  83.3                         Third trimester    58.7 - 214.0                      Postmenopausal         0.0 -   0.1   Estradiol     Status: None   Collection Time: 09/03/21  2:03 PM  Result Value Ref Range   Estradiol 7.9 pg/mL    Comment:                     Adult Female:                       Follicular phase   01.7 -   166.0                       Ovulation phase    85.8 -   498.0                       Luteal phase       43.8 -   211.0                       Postmenopausal     <6.0 -    54.7                     Pregnancy                       1st trimester     215.0 - >4300.0 Roche ECLIA methodology     IMPRESSION  Ms. Bobo is a 56 y.o. female who I performed a telephone encounter today for evaluation and management of: Cervical radiculopathy  PLAN  Ms. Fennimore is a pleasant 56 y.o presenting via telephone visit to review her progress with conservative treatment.  Unfortunately she has not been able to participate significantly with physical therapy as this made her symptoms worse and she was told by the therapist that it would not be beneficial.  She has since  discontinued this but continued to have right radiating arm pain that is making it difficult for her to work.  She is requesting extension of her FMLA and some changes in her accommodations.  We will provide this documentation for her.  We discussed next treatment options which would include injections.  I will place a referral for this.  No orders of the defined types were placed in this encounter.   DISPOSITION  Follow up: In person appointment in  2-3 weeks after injection  Loleta Dicker, PA   TELEPHONE DOCUMENTATION   I spent a total of 10 minutes non-face-to-face activities for this visit on the date of this encounter including review of imaging with the patient, discussion of reasonable accommodations, discussion of treatment options, and documentation.

## 2021-11-18 ENCOUNTER — Other Ambulatory Visit: Payer: Self-pay | Admitting: Family Medicine

## 2021-11-18 DIAGNOSIS — M5412 Radiculopathy, cervical region: Secondary | ICD-10-CM

## 2021-12-01 ENCOUNTER — Ambulatory Visit
Admission: RE | Admit: 2021-12-01 | Discharge: 2021-12-01 | Disposition: A | Discharge status: Home / Self Care | Payer: Managed Care, Other (non HMO) | Source: Ambulatory Visit | Attending: Family Medicine | Admitting: Family Medicine

## 2021-12-01 ENCOUNTER — Telehealth: Payer: Self-pay

## 2021-12-01 DIAGNOSIS — B9689 Other specified bacterial agents as the cause of diseases classified elsewhere: Secondary | ICD-10-CM

## 2021-12-01 DIAGNOSIS — M5412 Radiculopathy, cervical region: Secondary | ICD-10-CM

## 2021-12-01 MED ORDER — IOPAMIDOL (ISOVUE-M 300) INJECTION 61%
1.0000 mL | Freq: Once | INTRAMUSCULAR | Status: AC | PRN
  Administered 2021-12-01: 1 mL via EPIDURAL

## 2021-12-01 MED ORDER — METRONIDAZOLE 500 MG PO TABS: 500.0000 mg | ORAL_TABLET | Freq: Two times a day (BID) | ORAL | 0 refills | Status: DC

## 2021-12-01 MED ORDER — TRIAMCINOLONE ACETONIDE 40 MG/ML IJ SUSP (RADIOLOGY)
60.0000 mg | Freq: Once | INTRAMUSCULAR | Status: AC
  Administered 2021-12-01: 60 mg via EPIDURAL

## 2021-12-01 NOTE — Telephone Encounter (Signed)
Pt called triage reporting BV symptoms states shes sure this is what it is, Metronidazole sent in. Pt aware if does not see any improvement after finishing the rx then she should make an appointment.

## 2021-12-01 NOTE — Discharge Instructions (Signed)

## 2021-12-02 ENCOUNTER — Other Ambulatory Visit (HOSPITAL_COMMUNITY)
Admission: RE | Admit: 2021-12-02 | Discharge: 2021-12-02 | Disposition: A | Discharge status: Home / Self Care | Payer: Managed Care, Other (non HMO) | Source: Ambulatory Visit | Attending: Obstetrics and Gynecology | Admitting: Obstetrics and Gynecology

## 2021-12-02 ENCOUNTER — Ambulatory Visit: Payer: Managed Care, Other (non HMO)

## 2021-12-02 VITALS — BP 150/96 | HR 93 | Ht 61.0 in | Wt 141.0 lb

## 2021-12-02 DIAGNOSIS — N898 Other specified noninflammatory disorders of vagina: Secondary | ICD-10-CM | POA: Insufficient documentation

## 2021-12-02 DIAGNOSIS — R350 Frequency of micturition: Secondary | ICD-10-CM | POA: Diagnosis not present

## 2021-12-02 LAB — POCT URINALYSIS DIPSTICK
Blood, UA: POSITIVE
Glucose, UA: NEGATIVE
Nitrite, UA: NEGATIVE
Protein, UA: NEGATIVE
Spec Grav, UA: 1.01 (ref 1.010–1.025)
Urobilinogen, UA: 1 E.U./dL
pH, UA: 6 (ref 5.0–8.0)

## 2021-12-02 MED ORDER — SULFAMETHOXAZOLE-TRIMETHOPRIM 800-160 MG PO TABS: 1.0000 | ORAL_TABLET | Freq: Two times a day (BID) | ORAL | 0 refills | Status: AC

## 2021-12-02 NOTE — Progress Notes (Signed)
Pt here for nurse visit reporting vaginal odor and urine frequency and some dysuria , pt states she has been with a new partner and wants to make sure she doesn't have any sti infections. Aptima swab sent and urine culture sent to lab. Urinalysis shows leukocytes and blood sent in macrobid

## 2021-12-04 LAB — CERVICOVAGINAL ANCILLARY ONLY
Bacterial Vaginitis (gardnerella): NEGATIVE
Candida Glabrata: NEGATIVE
Candida Vaginitis: NEGATIVE
Chlamydia: NEGATIVE
Comment: NEGATIVE
Comment: NEGATIVE
Comment: NEGATIVE
Comment: NEGATIVE
Comment: NEGATIVE
Comment: NORMAL
Neisseria Gonorrhea: NEGATIVE
Trichomonas: NEGATIVE

## 2021-12-04 LAB — URINE CULTURE: Organism ID, Bacteria: NO GROWTH

## 2021-12-04 NOTE — Progress Notes (Signed)
Pt aware.

## 2021-12-04 NOTE — Progress Notes (Signed)
Please inform that urine culture shows no growth. She does not have a UTI.

## 2022-01-18 ENCOUNTER — Other Ambulatory Visit: Payer: Self-pay

## 2022-01-18 ENCOUNTER — Ambulatory Visit
Admission: RE | Admit: 2022-01-18 | Discharge: 2022-01-18 | Disposition: A | Discharge status: Home / Self Care | Payer: Self-pay | Source: Ambulatory Visit | Attending: Neurosurgery | Admitting: Neurosurgery

## 2022-01-18 DIAGNOSIS — Z049 Encounter for examination and observation for unspecified reason: Secondary | ICD-10-CM

## 2022-01-18 NOTE — Progress Notes (Unsigned)
Referring Physician:  Idelle Crouch, MD [facility] [place],  [place] [address]  Primary Physician:  Idelle Crouch, MD  History of Present Illness: 01/19/2022 [name] is here today with a chief complaint of cramping and pain into her left shoulder blade and down her left arm.  This been on going for 2 weeks or so.  She had severe pain down her right arm in April 2023 and has had numbness and discomfort since that time.  That goes to her middle finger in her right arm.  She occasionally has neck pain.  She also reports that she has dropped items as recently as last week.  She has some difficulty with her balance and tripped and fell last week.   She initially presented in April 2023 and underwent physical therapy which did not help her and made her symptoms worse.  She had an injection last month which helped a small amount.  She has tried multiple different medication regimens as noted below.  Nothing has really helped.  Sitting and laying on her left side makes her symptoms worse.  Her pain is as bad as 8 out of 10.   Bowel/Bladder Dysfunction: none  Conservative measures:  Physical therapy:  has participated in 2 visits at [facility] 08/10/21 and 08/13/21, no formal discharge note Multimodal medical therapy including regular antiinflammatories:  cyclobenzaprine, prednisone, norco, pregabalin, tylenol, gabapentin, ibuprofen Injections:  has received epidural steroid injections 12/01/21: right C7-T1 interlaminar ESI  Past Surgery: denies  Anna Andersen has symptoms of cervical myelopathy.  The symptoms are causing a significant impact on the patient's life.   I have utilized the care everywhere function in epic to review the outside records available from external health systems.  Review of Systems:  A 10 point review of systems is negative, except for the pertinent positives and negatives detailed in the HPI.  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    Past Surgical History: Past Surgical History:  Procedure Laterality Date   ADENOIDECTOMY W/ MYRINGOTOMY  age 47   CESAREAN SECTION  [phone]   COLONOSCOPY  2019   ROBOTIC ASSISTED LAPAROSCOPIC HYSTERECTOMY AND SALPINGECTOMY Bilateral 06/16/2020   Procedure: XI ROBOTIC ASSISTED LAPAROSCOPIC HYSTERECTOMY AND SALPINGECTOMY;  Surgeon: Rubie Maid, MD;  Location: [facility] ORS;  Service: Gynecology;  Laterality: Bilateral;   TUBAL LIGATION  1997   WRIST SURGERY Right 2006   bone spur    Allergies: Allergies as of 01/19/2022 - Review Complete 01/19/2022  Allergen Reaction Noted   Pravastatin Other (See Comments) 07/17/2020   Gluten meal Itching and Rash 01/09/2018    Medications: No outpatient medications have been marked as taking for the 01/19/22 encounter (Office Visit) with Meade Maw, MD.    Social History: Social History   Tobacco Use   Smoking status: Never   Smokeless tobacco: Never  Vaping Use   Vaping Use: Never used  Substance Use Topics   Alcohol use: Never   Drug use: Never    Family Medical History: Family History  Problem Relation Age of Onset   Cancer Maternal Grandmother    Cancer Paternal Grandmother    Ovarian cancer Neg Hx    Colon cancer Neg Hx    Diabetes Neg Hx  Physical Examination: Vitals:   01/19/22 0900 01/19/22 0905  BP: (!) 173/114 (!) 169/103  Pulse: (!) 108 (!) 108    General: Patient is well developed, well nourished, calm, collected, and in no apparent distress. Attention to examination is appropriate.  Neck:   Supple.  Full range of motion.  Respiratory: Patient is breathing without any difficulty.   NEUROLOGICAL:     Awake, alert, oriented to person, place, and time.  Speech is clear and fluent. Fund of  knowledge is appropriate.   Cranial Nerves: Pupils equal round and reactive to light.  Facial tone is symmetric.  Facial sensation is symmetric. Shoulder shrug is symmetric. Tongue protrusion is midline.  There is no pronator drift.  ROM of spine: full.    Strength: Side Biceps Triceps Deltoid Interossei Grip Wrist Ext. Wrist Flex.  R 5 4+ 5 5 4+ 5 5  L 4 5 4- 5 4+ 5 5   Side Iliopsoas Quads Hamstring PF DF EHL  R '5 5 5 5 5 5  '$ L '5 5 5 5 5 5   '$ Reflexes are 3+ and symmetric at the biceps, triceps, brachioradialis, patella and achilles.   Hoffman's is present on the right.   Bilateral upper and lower extremity sensation is intact to light touch.    No evidence of dysmetria noted.  Gait is normal.     Medical Decision Making  Imaging: Cervical spine from Jun 30, 2021 shows left-sided C4-5 foraminal stenosis with bilateral severe foraminal stenosis at C5-6 with moderate central stenosis at C5-6 as well as disc bulge causing right-sided foraminal stenosis at C6-7.  There is marrow edema at C4-5 associated with facet arthropathy.  I have personally reviewed the images and agree with the above interpretation.  Assessment and Plan: Anna Andersen is a pleasant 56 y.o. 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  She has central stenosis at C5-6 as well as foraminal stenosis at C4-5 and a left, C5-6 bilaterally, and C6-7 on the right.  I think she is symptomatic from this.  She has objective weakness on examination and has signs and symptoms of cervical myelopathy.  There is no role for conservative management for cervical myelopathy.  I recommended surgical intervention with C4-7 anterior cervical discectomy and fusion.  I discussed the planned procedure at length with the patient, including the risks, benefits, alternatives, and indications. The risks discussed include but are not limited to bleeding, infection, need for reoperation, spinal fluid  leak, stroke, vision loss, anesthetic complication, coma, paralysis, and even death. We also discussed the possibility of post-operative dysphagia, vocal cord paralysis, and the risk of adjacent segment disease in the future. I also described in detail that improvement was not guaranteed.  The patient expressed understanding of these risks, and asked that we proceed with surgery. I described the surgery in layman's terms, and gave ample opportunity for questions, which were answered to the best of my ability.   I spent a total of 30 minutes in this patient's care today. This time was spent reviewing pertinent records including imaging studies, obtaining and confirming history, performing a directed evaluation, formulating and discussing my recommendations, and documenting the visit within the medical record.   Thank you for involving me in the care of this patient.      Elisse Pennick K. Izora Ribas MD, [facility] Neurosurgery

## 2022-01-18 NOTE — H&P (View-Only) (Signed)
Referring Physician:  Idelle Crouch, MD [facility] [place],  [place] [address]  Primary Physician:  Idelle Crouch, MD  History of Present Illness: 01/19/2022 [name] is here today with a chief complaint of cramping and pain into her left shoulder blade and down her left arm.  This been on going for 2 weeks or so.  She had severe pain down her right arm in April 2023 and has had numbness and discomfort since that time.  That goes to her middle finger in her right arm.  She occasionally has neck pain.  She also reports that she has dropped items as recently as last week.  She has some difficulty with her balance and tripped and fell last week.   She initially presented in April 2023 and underwent physical therapy which did not help her and made her symptoms worse.  She had an injection last month which helped a small amount.  She has tried multiple different medication regimens as noted below.  Nothing has really helped.  Sitting and laying on her left side makes her symptoms worse.  Her pain is as bad as 8 out of 10.   Bowel/Bladder Dysfunction: none  Conservative measures:  Physical therapy:  has participated in 2 visits at [facility] 08/10/21 and 08/13/21, no formal discharge note Multimodal medical therapy including regular antiinflammatories:  cyclobenzaprine, prednisone, norco, pregabalin, tylenol, gabapentin, ibuprofen Injections:  has received epidural steroid injections 12/01/21: right C7-T1 interlaminar ESI  Past Surgery: denies  SHAREEN CAPWELL has symptoms of cervical myelopathy.  The symptoms are causing a significant impact on the patient's life.   I have utilized the care everywhere function in epic to review the outside records available from external health systems.  Review of Systems:  A 10 point review of systems is negative, except for the pertinent positives and negatives detailed in the HPI.  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    Past Surgical History: Past Surgical History:  Procedure Laterality Date   ADENOIDECTOMY W/ MYRINGOTOMY  age 42   CESAREAN SECTION  [phone]   COLONOSCOPY  2019   ROBOTIC ASSISTED LAPAROSCOPIC HYSTERECTOMY AND SALPINGECTOMY Bilateral 06/16/2020   Procedure: XI ROBOTIC ASSISTED LAPAROSCOPIC HYSTERECTOMY AND SALPINGECTOMY;  Surgeon: Rubie Maid, MD;  Location: [facility] ORS;  Service: Gynecology;  Laterality: Bilateral;   TUBAL LIGATION  1997   WRIST SURGERY Right 2006   bone spur    Allergies: Allergies as of 01/19/2022 - Review Complete 01/19/2022  Allergen Reaction Noted   Pravastatin Other (See Comments) 07/17/2020   Gluten meal Itching and Rash 01/09/2018    Medications: No outpatient medications have been marked as taking for the 01/19/22 encounter (Office Visit) with Meade Maw, MD.    Social History: Social History   Tobacco Use   Smoking status: Never   Smokeless tobacco: Never  Vaping Use   Vaping Use: Never used  Substance Use Topics   Alcohol use: Never   Drug use: Never    Family Medical History: Family History  Problem Relation Age of Onset   Cancer Maternal Grandmother    Cancer Paternal Grandmother    Ovarian cancer Neg Hx    Colon cancer Neg Hx    Diabetes Neg Hx  Physical Examination: Vitals:   01/19/22 0900 01/19/22 0905  BP: (!) 173/114 (!) 169/103  Pulse: (!) 108 (!) 108    General: Patient is well developed, well nourished, calm, collected, and in no apparent distress. Attention to examination is appropriate.  Neck:   Supple.  Full range of motion.  Respiratory: Patient is breathing without any difficulty.   NEUROLOGICAL:     Awake, alert, oriented to person, place, and time.  Speech is clear and fluent. Fund of  knowledge is appropriate.   Cranial Nerves: Pupils equal round and reactive to light.  Facial tone is symmetric.  Facial sensation is symmetric. Shoulder shrug is symmetric. Tongue protrusion is midline.  There is no pronator drift.  ROM of spine: full.    Strength: Side Biceps Triceps Deltoid Interossei Grip Wrist Ext. Wrist Flex.  R 5 4+ 5 5 4+ 5 5  L 4 5 4- 5 4+ 5 5   Side Iliopsoas Quads Hamstring PF DF EHL  R '5 5 5 5 5 5  '$ L '5 5 5 5 5 5   '$ Reflexes are 3+ and symmetric at the biceps, triceps, brachioradialis, patella and achilles.   Hoffman's is present on the right.   Bilateral upper and lower extremity sensation is intact to light touch.    No evidence of dysmetria noted.  Gait is normal.     Medical Decision Making  Imaging: Cervical spine from Jun 30, 2021 shows left-sided C4-5 foraminal stenosis with bilateral severe foraminal stenosis at C5-6 with moderate central stenosis at C5-6 as well as disc bulge causing right-sided foraminal stenosis at C6-7.  There is marrow edema at C4-5 associated with facet arthropathy.  I have personally reviewed the images and agree with the above interpretation.  Assessment and Plan: Ms. Kallstrom is a pleasant 56 y.o. 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  She has central stenosis at C5-6 as well as foraminal stenosis at C4-5 and a left, C5-6 bilaterally, and C6-7 on the right.  I think she is symptomatic from this.  She has objective weakness on examination and has signs and symptoms of cervical myelopathy.  There is no role for conservative management for cervical myelopathy.  I recommended surgical intervention with C4-7 anterior cervical discectomy and fusion.  I discussed the planned procedure at length with the patient, including the risks, benefits, alternatives, and indications. The risks discussed include but are not limited to bleeding, infection, need for reoperation, spinal fluid  leak, stroke, vision loss, anesthetic complication, coma, paralysis, and even death. We also discussed the possibility of post-operative dysphagia, vocal cord paralysis, and the risk of adjacent segment disease in the future. I also described in detail that improvement was not guaranteed.  The patient expressed understanding of these risks, and asked that we proceed with surgery. I described the surgery in layman's terms, and gave ample opportunity for questions, which were answered to the best of my ability.   I spent a total of 30 minutes in this patient's care today. This time was spent reviewing pertinent records including imaging studies, obtaining and confirming history, performing a directed evaluation, formulating and discussing my recommendations, and documenting the visit within the medical record.   Thank you for involving me in the care of this patient.      Hayly Litsey K. Izora Ribas MD, [facility] Neurosurgery

## 2022-01-19 ENCOUNTER — Ambulatory Visit (INDEPENDENT_AMBULATORY_CARE_PROVIDER_SITE_OTHER): Payer: Managed Care, Other (non HMO) | Admitting: Neurosurgery

## 2022-01-19 ENCOUNTER — Encounter: Payer: Self-pay | Admitting: Neurosurgery

## 2022-01-19 VITALS — BP 169/103 | HR 108 | Ht 61.0 in | Wt 138.4 lb

## 2022-01-19 DIAGNOSIS — M4802 Spinal stenosis, cervical region: Secondary | ICD-10-CM

## 2022-01-19 DIAGNOSIS — G959 Disease of spinal cord, unspecified: Secondary | ICD-10-CM

## 2022-01-19 DIAGNOSIS — M5412 Radiculopathy, cervical region: Secondary | ICD-10-CM

## 2022-01-19 NOTE — Patient Instructions (Signed)
Please see below for information in regards to your upcoming surgery:  Planned surgery: C4-7 anterior cervical discectomy and fusion   Surgery date: 02/08/22 - you will find out your arrival time the business day before your surgery.   Pre-op appointment at [facility]: we will call you with a date/time for this. Pre-admit testing is located on the first floor of the [facility], [place], [address]. Please bring all prescriptions in the original prescription bottles to your appointment, even if you have reviewed medications by phone with a pharmacy representative. Pre-op labs may be done at your pre-op appointment. You are not required to fast for these labs. Should you need to change your pre-op appointment, please call Pre-admit testing at [phone].      Brace: [facility] will contact you regarding an appointment for the brace you will use after surgery. Their number is [phone] should you miss their call or have an issue with your brace after surgery. You will need to bring the brace to the hospital on the day of surgery.    NSAIDS (Non-steroidal anti-inflammatory drugs): because you are having a fusion, no NSAIDS (such as ibuprofen, aleve, naproxen, meloxicam, diclofenac) for 3 months after surgery. Celebrex is an exception. Tylenol is ok because it is not an NSAID.   Because you are having a fusion: for appointments after your 2 week follow-up: please arrive at the [facility] outpatient imaging center ([place], [place]) or [facility] one hour prior to your appointment for x-rays. This applies to every appointment after your 2 week follow-up. Failure to do so may result in your appointment being rescheduled.   If you have FMLA/disability paperwork, please drop it off or fax it to [phone], attention [place].   We can be reached by phone or mychart 8am-4pm, Monday-Friday. If you have  any questions/concerns before or after surgery, you can reach [place] at [phone], or you can send a mychart message. If you have a concern after hours that cannot wait until normal business hours, you can call [phone] and ask to page the neurosurgeon on call for [place].    Appointments/FMLA & disability paperwork: [place] Nurse: Anna Andersen  Medical assistant: Anna Andersen Physician Assistant's: Anna Andersen & Anna Andersen Surgeon: Anna Maw, MD

## 2022-01-20 ENCOUNTER — Other Ambulatory Visit: Payer: Self-pay

## 2022-01-20 DIAGNOSIS — Z049 Encounter for examination and observation for unspecified reason: Secondary | ICD-10-CM

## 2022-01-22 ENCOUNTER — Other Ambulatory Visit: Payer: Self-pay | Admitting: Internal Medicine

## 2022-01-22 DIAGNOSIS — Z1231 Encounter for screening mammogram for malignant neoplasm of breast: Secondary | ICD-10-CM

## 2022-01-28 ENCOUNTER — Ambulatory Visit: Payer: Managed Care, Other (non HMO) | Admitting: Neurosurgery

## 2022-02-01 ENCOUNTER — Encounter
Admission: RE | Admit: 2022-02-01 | Discharge: 2022-02-01 | Disposition: A | Discharge status: Home / Self Care | Payer: Managed Care, Other (non HMO) | Source: Ambulatory Visit | Attending: Neurosurgery | Admitting: Neurosurgery

## 2022-02-01 VITALS — BP 142/98 | HR 78 | Resp 16 | Ht 61.0 in | Wt 138.7 lb

## 2022-02-01 DIAGNOSIS — I10 Essential (primary) hypertension: Secondary | ICD-10-CM | POA: Insufficient documentation

## 2022-02-01 DIAGNOSIS — Z01812 Encounter for preprocedural laboratory examination: Secondary | ICD-10-CM

## 2022-02-01 DIAGNOSIS — Z01818 Encounter for other preprocedural examination: Secondary | ICD-10-CM | POA: Diagnosis not present

## 2022-02-01 LAB — TYPE AND SCREEN
ABO/RH(D): A POS
Antibody Screen: NEGATIVE

## 2022-02-01 LAB — SURGICAL PCR SCREEN
MRSA, PCR: NEGATIVE
Staphylococcus aureus: NEGATIVE

## 2022-02-01 NOTE — Patient Instructions (Addendum)
Your procedure is scheduled on:02-08-22 Monday Report to the Registration Desk on the 1st floor of the [facility].Then proceed to the 2nd floor Surgery Desk To find out your arrival time, please call [phone] between 1PM - 3PM on:02-05-22 Friday If your arrival time is 6:00 am, do not arrive prior to that time as the [facility] entrance doors do not open until 6:00 am.  REMEMBER: Instructions that are not followed completely may result in serious medical risk, up to and including death; or upon the discretion of your surgeon and anesthesiologist your surgery may need to be rescheduled.  Do not eat food after midnight the night before surgery.  No gum chewing, lozengers or hard candies.  You may however, drink CLEAR liquids up to 2 hours before you are scheduled to arrive for your surgery. Do not drink anything within 2 hours of your scheduled arrival time.  Clear liquids include: - water  - apple juice without pulp - gatorade (not RED colors) - black coffee or tea (Do NOT add milk or creamers to the coffee or tea) Do NOT drink anything that is not on this list.  TAKE THESE MEDICATIONS THE MORNING OF SURGERY WITH A SIP OF WATER: -propranolol (INDERAL)  -rosuvastatin (CRESTOR)   One week prior to surgery: Stop Anti-inflammatories (NSAIDS) such as Advil, Aleve, Ibuprofen, Motrin, Naproxen, Naprosyn and Aspirin based products such as Excedrin, Goodys Powder, BC Powder.You may however, take Tylenol if needed for pain up until the day of surgery.  Stop ANY OVER THE COUNTER supplements/vitamins NOW (02-01-22) until after surgery (Black Cohosh and Estroven)  No Alcohol for 24 hours before or after surgery.  No Smoking including e-cigarettes for 24 hours prior to surgery.  No chewable tobacco products for at least 6 hours prior to surgery.  No nicotine patches on the day of surgery.  Do not use any "recreational" drugs for at least a week prior to your surgery.  Please be advised  that the combination of cocaine and anesthesia may have negative outcomes, up to and including death. If you test positive for cocaine, your surgery will be cancelled.  On the morning of surgery brush your teeth with toothpaste and water, you may rinse your mouth with mouthwash if you wish. Do not swallow any toothpaste or mouthwash.  Use CHG Soap as directed on instruction sheet.  Do not wear jewelry, make-up, hairpins, clips or nail polish.  Do not wear lotions, powders, or perfumes.   Do not shave body from the neck down 48 hours prior to surgery just in case you cut yourself which could leave a site for infection.  Also, freshly shaved skin may become irritated if using the CHG soap.  Contact lenses, hearing aids and dentures may not be worn into surgery.  Do not bring valuables to the hospital. [facility] is not responsible for any missing/lost belongings or valuables.   Notify your doctor if there is any change in your medical condition (cold, fever, infection).  Wear comfortable clothing (specific to your surgery type) to the hospital.  After surgery, you can help prevent lung complications by doing breathing exercises.  Take deep breaths and cough every 1-2 hours. Your doctor may order a device called an Incentive Spirometer to help you take deep breaths. When coughing or sneezing, hold a pillow firmly against your incision with both hands. This is called "splinting." Doing this helps protect your incision. It also decreases belly discomfort.  If you are being admitted to the  hospital overnight, leave your suitcase in the car. After surgery it may be brought to your room.  If you are being discharged the day of surgery, you will not be allowed to drive home. You will need a responsible adult (18 years or older) to drive you home and stay with you that night.   If you are taking public transportation, you will need to have a responsible adult (18 years or older) with  you. Please confirm with your physician that it is acceptable to use public transportation.   Please call the [place] Dept. at [phone] if you have any questions about these instructions.  Surgery Visitation Policy:  Patients undergoing a surgery or procedure may have two family members or support persons with them as long as the person is not COVID-19 positive or experiencing its symptoms.   Inpatient Visitation:    Visiting hours are 7 a.m. to 8 p.m. Up to four visitors are allowed at one time in a patient room. The visitors may rotate out with other people during the day. One designated support person (adult) may remain overnight.  Due to an increase in RSV and influenza rates and associated hospitalizations, children ages 24 and under will not be able to visit patients in [facility]. Masks continue to be strongly recommended.

## 2022-02-08 ENCOUNTER — Ambulatory Visit: Payer: Managed Care, Other (non HMO) | Admitting: Urgent Care

## 2022-02-08 ENCOUNTER — Ambulatory Visit: Payer: Managed Care, Other (non HMO)

## 2022-02-08 ENCOUNTER — Encounter
Admission: RE | Disposition: A | Discharge status: Home / Self Care | Payer: Self-pay | Source: Home / Self Care | Attending: Neurosurgery

## 2022-02-08 ENCOUNTER — Ambulatory Visit: Payer: Managed Care, Other (non HMO) | Admitting: Anesthesiology

## 2022-02-08 ENCOUNTER — Encounter: Payer: Self-pay | Admitting: Neurosurgery

## 2022-02-08 ENCOUNTER — Observation Stay
Admission: RE | Admit: 2022-02-08 | Discharge: 2022-02-09 | Disposition: A | Discharge status: Home / Self Care | Payer: Managed Care, Other (non HMO) | Attending: Neurosurgery | Admitting: Neurosurgery

## 2022-02-08 ENCOUNTER — Other Ambulatory Visit: Payer: Self-pay

## 2022-02-08 DIAGNOSIS — Z8616 Personal history of COVID-19: Secondary | ICD-10-CM | POA: Insufficient documentation

## 2022-02-08 DIAGNOSIS — Z049 Encounter for examination and observation for unspecified reason: Secondary | ICD-10-CM

## 2022-02-08 DIAGNOSIS — G959 Disease of spinal cord, unspecified: Secondary | ICD-10-CM | POA: Diagnosis not present

## 2022-02-08 DIAGNOSIS — M4802 Spinal stenosis, cervical region: Secondary | ICD-10-CM | POA: Diagnosis present

## 2022-02-08 DIAGNOSIS — I10 Essential (primary) hypertension: Secondary | ICD-10-CM | POA: Insufficient documentation

## 2022-02-08 DIAGNOSIS — Z981 Arthrodesis status: Secondary | ICD-10-CM

## 2022-02-08 DIAGNOSIS — M5412 Radiculopathy, cervical region: Secondary | ICD-10-CM | POA: Diagnosis not present

## 2022-02-08 HISTORY — PX: ANTERIOR CERVICAL DECOMP/DISCECTOMY FUSION: SHX1161

## 2022-02-08 LAB — CREATININE, SERUM
Creatinine, Ser: 0.75 mg/dL (ref 0.44–1.00)
GFR, Estimated: >60 mL/min (ref >60)

## 2022-02-08 SURGERY — ANTERIOR CERVICAL DECOMPRESSION/DISCECTOMY FUSION 3 LEVELS
Anesthesia: General | Site: Neck

## 2022-02-08 MED ORDER — KETAMINE HCL 50 MG/5ML IJ SOSY
PREFILLED_SYRINGE | INTRAMUSCULAR | Status: AC
  Filled 2022-02-08: qty 5

## 2022-02-08 MED ORDER — MENTHOL 3 MG MT LOZG: 1.0000 | LOZENGE | OROMUCOSAL | Status: DC | PRN

## 2022-02-08 MED ORDER — HYDROMORPHONE HCL 1 MG/ML IJ SOLN
0.2500 mg | INTRAMUSCULAR | Status: DC | PRN
  Administered 2022-02-08 (×2): 0.5 mg via INTRAVENOUS

## 2022-02-08 MED ORDER — SUCCINYLCHOLINE CHLORIDE 200 MG/10ML IV SOSY
PREFILLED_SYRINGE | INTRAVENOUS | Status: AC
  Filled 2022-02-08: qty 10

## 2022-02-08 MED ORDER — GLYCOPYRROLATE 0.2 MG/ML IJ SOLN
INTRAMUSCULAR | Status: AC
  Filled 2022-02-08: qty 1

## 2022-02-08 MED ORDER — CEFAZOLIN SODIUM-DEXTROSE 2-4 GM/100ML-% IV SOLN
INTRAVENOUS | Status: AC
  Filled 2022-02-08: qty 100

## 2022-02-08 MED ORDER — ONDANSETRON HCL 4 MG/2ML IJ SOLN
INTRAMUSCULAR | Status: AC
  Filled 2022-02-08: qty 2

## 2022-02-08 MED ORDER — CHLORHEXIDINE GLUCONATE 0.12 % MT SOLN: 15.0000 mL | Freq: Once | OROMUCOSAL | Status: AC

## 2022-02-08 MED ORDER — ACETAMINOPHEN 10 MG/ML IV SOLN
INTRAVENOUS | Status: AC
  Filled 2022-02-08: qty 100

## 2022-02-08 MED ORDER — ACETAMINOPHEN 10 MG/ML IV SOLN
1000.0000 mg | Freq: Once | INTRAVENOUS | Status: AC
  Administered 2022-02-08: 1000 mg via INTRAVENOUS

## 2022-02-08 MED ORDER — MIDAZOLAM HCL 2 MG/2ML IJ SOLN
INTRAMUSCULAR | Status: AC
  Filled 2022-02-08: qty 2

## 2022-02-08 MED ORDER — PROPRANOLOL HCL 20 MG PO TABS
80.0000 mg | ORAL_TABLET | Freq: Two times a day (BID) | ORAL | Status: DC
  Administered 2022-02-08 – 2022-02-09 (×2): 80 mg via ORAL
  Filled 2022-02-08 (×2): qty 4

## 2022-02-08 MED ORDER — OXYCODONE HCL 5 MG PO TABS: 10.0000 mg | ORAL_TABLET | ORAL | Status: DC | PRN

## 2022-02-08 MED ORDER — DEXMEDETOMIDINE HCL IN NACL 80 MCG/20ML IV SOLN
INTRAVENOUS | Status: DC | PRN
  Administered 2022-02-08: 12 ug via BUCCAL

## 2022-02-08 MED ORDER — DEXAMETHASONE SODIUM PHOSPHATE 10 MG/ML IJ SOLN
INTRAMUSCULAR | Status: AC
  Filled 2022-02-08: qty 1

## 2022-02-08 MED ORDER — CHLORHEXIDINE GLUCONATE 0.12 % MT SOLN
OROMUCOSAL | Status: AC
  Administered 2022-02-08: 15 mL via OROMUCOSAL
  Filled 2022-02-08: qty 15

## 2022-02-08 MED ORDER — FENTANYL CITRATE (PF) 100 MCG/2ML IJ SOLN
INTRAMUSCULAR | Status: AC
  Filled 2022-02-08: qty 2

## 2022-02-08 MED ORDER — PHENYLEPHRINE HCL-NACL 20-0.9 MG/250ML-% IV SOLN
INTRAVENOUS | Status: AC
  Filled 2022-02-08: qty 250

## 2022-02-08 MED ORDER — PHENYLEPHRINE HCL-NACL 20-0.9 MG/250ML-% IV SOLN
INTRAVENOUS | Status: DC | PRN
  Administered 2022-02-08: 40 ug/min via INTRAVENOUS

## 2022-02-08 MED ORDER — METHOCARBAMOL 500 MG PO TABS
ORAL_TABLET | ORAL | Status: AC
  Filled 2022-02-08: qty 1

## 2022-02-08 MED ORDER — POLYETHYLENE GLYCOL 3350 17 G PO PACK: 17.0000 g | PACK | Freq: Every day | ORAL | Status: DC | PRN

## 2022-02-08 MED ORDER — FENTANYL CITRATE (PF) 100 MCG/2ML IJ SOLN
INTRAMUSCULAR | Status: DC | PRN
  Administered 2022-02-08: 25 ug via INTRAVENOUS
  Administered 2022-02-08 (×2): 50 ug via INTRAVENOUS

## 2022-02-08 MED ORDER — MORPHINE SULFATE (PF) 2 MG/ML IV SOLN: 1.0000 mg | INTRAVENOUS | Status: DC | PRN

## 2022-02-08 MED ORDER — LIDOCAINE HCL (CARDIAC) PF 100 MG/5ML IV SOSY
PREFILLED_SYRINGE | INTRAVENOUS | Status: DC | PRN
  Administered 2022-02-08: 100 mg via INTRAVENOUS

## 2022-02-08 MED ORDER — ROSUVASTATIN CALCIUM 10 MG PO TABS
20.0000 mg | ORAL_TABLET | Freq: Every day | ORAL | Status: DC
  Administered 2022-02-09: 20 mg via ORAL
  Filled 2022-02-08: qty 2

## 2022-02-08 MED ORDER — FAMOTIDINE 20 MG PO TABS
ORAL_TABLET | ORAL | Status: AC
  Administered 2022-02-08: 20 mg via ORAL
  Filled 2022-02-08: qty 1

## 2022-02-08 MED ORDER — CEFAZOLIN IN SODIUM CHLORIDE 2-0.9 GM/100ML-% IV SOLN
2.0000 g | Freq: Once | INTRAVENOUS | Status: AC
  Administered 2022-02-08: 2 g via INTRAVENOUS
  Filled 2022-02-08: qty 100

## 2022-02-08 MED ORDER — ONDANSETRON HCL 4 MG PO TABS: 4.0000 mg | ORAL_TABLET | Freq: Four times a day (QID) | ORAL | Status: DC | PRN

## 2022-02-08 MED ORDER — REMIFENTANIL HCL 1 MG IV SOLR
INTRAVENOUS | Status: AC
  Filled 2022-02-08: qty 1000

## 2022-02-08 MED ORDER — SODIUM CHLORIDE 0.9% FLUSH: 3.0000 mL | INTRAVENOUS | Status: DC | PRN

## 2022-02-08 MED ORDER — LACTATED RINGERS IV SOLN: INTRAVENOUS | Status: DC | PRN

## 2022-02-08 MED ORDER — GLYCOPYRROLATE 0.2 MG/ML IJ SOLN
INTRAMUSCULAR | Status: DC | PRN
  Administered 2022-02-08 (×2): .1 mg via INTRAVENOUS

## 2022-02-08 MED ORDER — KETAMINE HCL 10 MG/ML IJ SOLN
INTRAMUSCULAR | Status: DC | PRN
  Administered 2022-02-08: 30 mg via INTRAVENOUS

## 2022-02-08 MED ORDER — EPHEDRINE SULFATE (PRESSORS) 50 MG/ML IJ SOLN
INTRAMUSCULAR | Status: DC | PRN
  Administered 2022-02-08: 5 mg via INTRAVENOUS

## 2022-02-08 MED ORDER — KETOROLAC TROMETHAMINE 15 MG/ML IJ SOLN
15.0000 mg | Freq: Four times a day (QID) | INTRAMUSCULAR | Status: AC
  Administered 2022-02-08 – 2022-02-09 (×3): 15 mg via INTRAVENOUS
  Filled 2022-02-08 (×3): qty 1

## 2022-02-08 MED ORDER — METHOCARBAMOL 500 MG PO TABS
500.0000 mg | ORAL_TABLET | Freq: Four times a day (QID) | ORAL | Status: DC | PRN
  Administered 2022-02-08: 500 mg via ORAL

## 2022-02-08 MED ORDER — BUPIVACAINE-EPINEPHRINE 0.5% -1:200000 IJ SOLN
INTRAMUSCULAR | Status: DC | PRN
  Administered 2022-02-08: 5 mL

## 2022-02-08 MED ORDER — BISACODYL 10 MG RE SUPP: 10.0000 mg | Freq: Every day | RECTAL | Status: DC | PRN

## 2022-02-08 MED ORDER — ONDANSETRON HCL 4 MG/2ML IJ SOLN
4.0000 mg | Freq: Four times a day (QID) | INTRAMUSCULAR | Status: DC | PRN
  Administered 2022-02-08: 4 mg via INTRAVENOUS
  Filled 2022-02-08: qty 2

## 2022-02-08 MED ORDER — PHENOL 1.4 % MT LIQD: 1.0000 | OROMUCOSAL | Status: DC | PRN

## 2022-02-08 MED ORDER — PROPOFOL 500 MG/50ML IV EMUL
INTRAVENOUS | Status: DC | PRN
  Administered 2022-02-08: 180 ug/kg/min via INTRAVENOUS

## 2022-02-08 MED ORDER — LACTATED RINGERS IV SOLN: INTRAVENOUS | Status: DC

## 2022-02-08 MED ORDER — LIDOCAINE HCL (PF) 2 % IJ SOLN
INTRAMUSCULAR | Status: AC
  Filled 2022-02-08: qty 5

## 2022-02-08 MED ORDER — SODIUM CHLORIDE 0.9 % IV SOLN: INTRAVENOUS | Status: DC

## 2022-02-08 MED ORDER — PROPOFOL 10 MG/ML IV BOLUS
INTRAVENOUS | Status: DC | PRN
  Administered 2022-02-08: 120 mg via INTRAVENOUS
  Administered 2022-02-08: 40 mg via INTRAVENOUS
  Administered 2022-02-08: 20 mg via INTRAVENOUS

## 2022-02-08 MED ORDER — ENOXAPARIN SODIUM 40 MG/0.4ML IJ SOSY
40.0000 mg | PREFILLED_SYRINGE | INTRAMUSCULAR | Status: DC
  Administered 2022-02-09: 40 mg via SUBCUTANEOUS
  Filled 2022-02-08: qty 0.4

## 2022-02-08 MED ORDER — ONDANSETRON HCL 4 MG/2ML IJ SOLN
INTRAMUSCULAR | Status: DC | PRN
  Administered 2022-02-08: 4 mg via INTRAVENOUS

## 2022-02-08 MED ORDER — PROPOFOL 1000 MG/100ML IV EMUL
INTRAVENOUS | Status: AC
  Filled 2022-02-08: qty 100

## 2022-02-08 MED ORDER — METHOCARBAMOL 1000 MG/10ML IJ SOLN
500.0000 mg | Freq: Four times a day (QID) | INTRAVENOUS | Status: DC | PRN
  Filled 2022-02-08: qty 5

## 2022-02-08 MED ORDER — SURGIFLO WITH THROMBIN (HEMOSTATIC MATRIX KIT) OPTIME
TOPICAL | Status: DC | PRN
  Administered 2022-02-08: 1 via TOPICAL

## 2022-02-08 MED ORDER — FAMOTIDINE 20 MG PO TABS: 20.0000 mg | ORAL_TABLET | Freq: Once | ORAL | Status: AC

## 2022-02-08 MED ORDER — MIDAZOLAM HCL 2 MG/2ML IJ SOLN
INTRAMUSCULAR | Status: DC | PRN
  Administered 2022-02-08 (×2): 2 mg via INTRAVENOUS

## 2022-02-08 MED ORDER — OXYCODONE HCL 5 MG/5ML PO SOLN: 5.0000 mg | Freq: Once | ORAL | Status: DC | PRN

## 2022-02-08 MED ORDER — HYDROMORPHONE HCL 1 MG/ML IJ SOLN
INTRAMUSCULAR | Status: AC
  Filled 2022-02-08: qty 1

## 2022-02-08 MED ORDER — SODIUM CHLORIDE 0.9 % IV SOLN
INTRAVENOUS | Status: DC | PRN
  Administered 2022-02-08: .2 ug/kg/min via INTRAVENOUS

## 2022-02-08 MED ORDER — ORAL CARE MOUTH RINSE: 15.0000 mL | Freq: Once | OROMUCOSAL | Status: AC

## 2022-02-08 MED ORDER — 0.9 % SODIUM CHLORIDE (POUR BTL) OPTIME
TOPICAL | Status: DC | PRN
  Administered 2022-02-08: 1000 mL

## 2022-02-08 MED ORDER — ACETAMINOPHEN 500 MG PO TABS
1000.0000 mg | ORAL_TABLET | Freq: Four times a day (QID) | ORAL | Status: DC
  Administered 2022-02-09 (×2): 1000 mg via ORAL
  Filled 2022-02-08 (×3): qty 2

## 2022-02-08 MED ORDER — SUCCINYLCHOLINE CHLORIDE 200 MG/10ML IV SOSY
PREFILLED_SYRINGE | INTRAVENOUS | Status: DC | PRN
  Administered 2022-02-08: 60 mg via INTRAVENOUS

## 2022-02-08 MED ORDER — SODIUM CHLORIDE 0.9 % IV SOLN: 250.0000 mL | INTRAVENOUS | Status: DC

## 2022-02-08 MED ORDER — DEXAMETHASONE SODIUM PHOSPHATE 10 MG/ML IJ SOLN
INTRAMUSCULAR | Status: DC | PRN
  Administered 2022-02-08: 10 mg via INTRAVENOUS

## 2022-02-08 MED ORDER — OXYCODONE HCL 5 MG PO TABS: 5.0000 mg | ORAL_TABLET | ORAL | Status: DC | PRN

## 2022-02-08 MED ORDER — MAGNESIUM CITRATE PO SOLN: 1.0000 | Freq: Once | ORAL | Status: DC | PRN

## 2022-02-08 MED ORDER — SODIUM CHLORIDE 0.9% FLUSH
3.0000 mL | Freq: Two times a day (BID) | INTRAVENOUS | Status: DC
  Administered 2022-02-08 – 2022-02-09 (×2): 3 mL via INTRAVENOUS

## 2022-02-08 MED ORDER — OXYCODONE HCL 5 MG PO TABS: 5.0000 mg | ORAL_TABLET | Freq: Once | ORAL | Status: DC | PRN

## 2022-02-08 MED ORDER — KETOROLAC TROMETHAMINE 15 MG/ML IJ SOLN
INTRAMUSCULAR | Status: AC
  Filled 2022-02-08: qty 1

## 2022-02-08 MED ORDER — PHENYLEPHRINE HCL (PRESSORS) 10 MG/ML IV SOLN
INTRAVENOUS | Status: DC | PRN
  Administered 2022-02-08: 80 ug via INTRAVENOUS
  Administered 2022-02-08: 40 ug via INTRAVENOUS

## 2022-02-08 MED ORDER — ALBUTEROL SULFATE HFA 108 (90 BASE) MCG/ACT IN AERS
INHALATION_SPRAY | RESPIRATORY_TRACT | Status: AC
  Filled 2022-02-08: qty 6.7

## 2022-02-08 SURGICAL SUPPLY — 47 items
12mm Caspar Pins Globus Lawson 93506 Inactive IMPLANT
BASIN KIT SINGLE STR (MISCELLANEOUS) ×1 IMPLANT
BULB RESERV EVAC DRAIN JP 100C (MISCELLANEOUS) IMPLANT
BUR NEURO DRILL SOFT 3.0X3.8M (BURR) ×1 IMPLANT
DERMABOND ADVANCED .7 DNX12 (GAUZE/BANDAGES/DRESSINGS) ×1 IMPLANT
DRAIN CHANNEL JP 10F RND 20C F (MISCELLANEOUS) IMPLANT
DRAPE C ARM PK CFD 31 SPINE (DRAPES) ×1 IMPLANT
DRAPE LAPAROTOMY 77X122 PED (DRAPES) ×1 IMPLANT
DRAPE MICROSCOPE SPINE 48X150 (DRAPES) ×1 IMPLANT
DRAPE SURG 17X11 SM STRL (DRAPES) ×1 IMPLANT
FEE INTRAOP CADWELL SUPPLY NCS (MISCELLANEOUS) IMPLANT
FEE INTRAOP MONITOR IMPULS NCS (MISCELLANEOUS) IMPLANT
GLOVE BIOGEL PI IND STRL 6.5 (GLOVE) ×1 IMPLANT
GLOVE BIOGEL PI IND STRL 8.5 (GLOVE) ×1 IMPLANT
GLOVE SURG SYN 6.5 ES PF (GLOVE) ×2 IMPLANT
GLOVE SURG SYN 6.5 PF PI (GLOVE) ×2 IMPLANT
GLOVE SURG SYN 8.5  E (GLOVE) ×3
GLOVE SURG SYN 8.5 E (GLOVE) ×3 IMPLANT
GLOVE SURG SYN 8.5 PF PI (GLOVE) ×3 IMPLANT
GOWN SRG LRG LVL 4 IMPRV REINF (GOWNS) ×2 IMPLANT
GOWN SRG XL LVL 3 NONREINFORCE (GOWNS) ×1 IMPLANT
GOWN STRL NON-REIN TWL XL LVL3 (GOWNS) ×1
GOWN STRL REIN LRG LVL4 (GOWNS) ×2
INTRAOP CADWELL SUPPLY FEE NCS (MISCELLANEOUS) ×1
INTRAOP DISP SUPPLY FEE NCS (MISCELLANEOUS) ×1
INTRAOP MONITOR FEE IMPULS NCS (MISCELLANEOUS) ×1
INTRAOP MONITOR FEE IMPULSE (MISCELLANEOUS) ×1
KIT TURNOVER KIT A (KITS) ×1 IMPLANT
MANIFOLD NEPTUNE II (INSTRUMENTS) ×1 IMPLANT
NS IRRIG 1000ML POUR BTL (IV SOLUTION) ×1 IMPLANT
PACK LAMINECTOMY NEURO (CUSTOM PROCEDURE TRAY) ×1 IMPLANT
PAD ARMBOARD 7.5X6 YLW CONV (MISCELLANEOUS) ×2 IMPLANT
PIN CASPAR 14 (PIN) ×1 IMPLANT
PIN CASPAR 14MM (PIN)
PLATE ANT CERV XTEND 3 LV 45 (Plate) IMPLANT
PUTTY DBM PROPEL SM (Putty) IMPLANT
SCREW VAR 4.2 XD SELF DRILL 14 (Screw) IMPLANT
SPACER C HEDRON 12X14 6 7D (Spacer) IMPLANT
SPACER C HEDRON 12X14 7M 7D (Spacer) IMPLANT
SPONGE KITTNER 5P (MISCELLANEOUS) ×2 IMPLANT
STAPLER SKIN PROX 35W (STAPLE) IMPLANT
SURGIFLO W/THROMBIN 8M KIT (HEMOSTASIS) ×1 IMPLANT
SUT V-LOC 90 ABS DVC 3-0 CL (SUTURE) ×1 IMPLANT
SUT VIC AB 3-0 SH 8-18 (SUTURE) ×1 IMPLANT
SYR 20ML LL LF (SYRINGE) ×1 IMPLANT
TAPE CLOTH 3X10 WHT NS LF (GAUZE/BANDAGES/DRESSINGS) ×3 IMPLANT
TRAP FLUID SMOKE EVACUATOR (MISCELLANEOUS) ×1 IMPLANT

## 2022-02-08 NOTE — Transfer of Care (Signed)
Immediate Anesthesia Transfer of Care Note  Patient: Anna Andersen  Procedure(s) Performed: C4-7 ANTERIOR CERVICAL DISCECTOMY AND FUSION (GLOBUS HEDRON) (Neck)  Patient Location: PACU  Anesthesia Type:General  Level of Consciousness: drowsy  Airway & Oxygen Therapy: Patient Spontanous Breathing and Patient connected to face mask oxygen  Post-op Assessment: Report given to RN and Post -op Vital signs reviewed and stable  Post vital signs: Reviewed and stable  Last Vitals:  Vitals Value Taken Time  BP 139/85 02/08/22 1832  Temp 36.1 C 02/08/22 1832  Pulse 89 02/08/22 1841  Resp 20 02/08/22 1841  SpO2 100 % 02/08/22 1841  Vitals shown include unvalidated device data.  Last Pain:  Vitals:   02/08/22 1832  TempSrc:   PainSc: Asleep         Complications: No notable events documented.

## 2022-02-08 NOTE — Op Note (Addendum)
Indications: Anna Andersen is a 56 yo female who presented with: G95.9 Cervical myelopathy, M48.02 Cervical stenosis of spinal canal, M54.12 Cervical radiculopathy . She failed conservative management prompting surgical intervention  Findings: stenosis  Preoperative Diagnosis: G95.9 Cervical myelopathy, M48.02 Cervical stenosis of spinal canal, M54.12 Cervical radiculopathy  Postoperative Diagnosis: same   EBL: 25 ml IVF: see AR ml Drains: none Disposition: Extubated and Stable to PACU Complications: none  No foley catheter was placed.   Preoperative Note:   Risks of surgery discussed include: infection, bleeding, stroke, coma, death, paralysis, CSF leak, nerve/spinal cord injury, numbness, tingling, weakness, complex regional pain syndrome, recurrent stenosis and/or disc herniation, vascular injury, development of instability, neck/back pain, need for further surgery, persistent symptoms, development of deformity, and the risks of anesthesia. The patient understood these risks and agreed to proceed.  Operative Note:   Operative Procedure: 1. Anterior Cervical Discectomy C4-5 including bilateral foraminotomies and end plate preparation  2. Anterior Cervical Discectomy C5-6 including bilateral foraminotomies and end plate preparation  3. Anterior Cervical Discectomy C6-7 including bilateral foraminotomies and end plate preparation 4. Anterior Spinal Instrumentation C4 to 7  5. Anterior arthrodesis from C4 to C7 with placement of biomechanical devices at C4-5, C5-6, and C6-7 6. Use of the operative microscope 7. Use of intraoperative flouroscopy  PROCEDURE IN DETAIL: After obtaining informed consent, the patient taken to the operating room, placed in supine position, general anesthesia induced.  The patient had a small shoulder roll placed behind their shoulders.  The patient received preop antibiotics and IV Decadron.  The patient had a neck incision outlined, was prepped and draped in  usual sterile fashion. The incision was injected with local anesthetic.   An incision was opened, dissection taken down medial to the carotid artery and jugular vein, lateral to the trachea and esophagus.  The prevertebral fascia identified and a localizing x-ray demonstrated the correct level.  The longus colli were dissected laterally, and self-retaining retractors placed to open the operative field. The microscope was then brought into the field.  With this complete, distractor pins were placed in the vertebral bodies of C4, C6, and C7. The distractor was placed from C4-6, and the anuli at C4-5 and C5-6 were opened using a bovie.  Curettes and pituitary rongeurs used to remove the majority of the C4-5 disk, then the drill was used to remove the posterior osteophyte and begin the foraminotomies. The nerve hook was used to elevate the posterior longitudinal ligament, which was then removed with Kerrison rongeurs to complete decompression of the spinal cord. The Kerrison rongeurs were then used to complete the foraminotomies bilaterally to decompress the nerve roots. The nerve hook could be passed out each foramen, ensuring decompression of the nerve roots. Meticulous hemostasis was obtained. A biomechanical device (Globus Hedron C 6 mm height x 14 mm width by 12 mm depth) was placed at C4/5. The device had been filled with demineralized bone matrix for aid in arthrodesis.  Please note that the procedure included removal of the disc, removal of the posterior osteophytes, and removal of the posterior longitudinal ligament to ensure decompression of the spinal cord.  Additionally, foraminotomies were performed on both sides of the spinal canal to decompress the nerve roots.  We then performed the same procedure at C5-6. Curettes and pituitary rongeurs used to remove the majority of the C45-6 disk, then the drill was used to remove the posterior osteophyte and begin the foraminotomies. The nerve hook was used to  elevate the posterior  longitudinal ligament, which was then removed with Kerrison rongeurs to complete decompression of the spinal cord. The Kerrison rongeurs were then used to complete the foraminotomies bilaterally to decompress the nerve roots. The nerve hook could be passed out each foramen, ensuring decompression of the nerve roots. Meticulous hemostasis was obtained. A biomechanical device (Globus Hedron C 7 mm height x 14 mm width by 12 mm depth) was placed at C5/6. The device had been filled with demineralized bone matrix for aid in arthrodesis.  Please note that the procedure included removal of the disc, removal of the posterior osteophytes, and removal of the posterior longitudinal ligament to ensure decompression of the spinal cord.  Additionally, foraminotomies were performed on both sides of the spinal canal to decompress the nerve roots.  The caspar distractor was removed and placed at C6-7. The pin at C4 was removed and bone wax used for hemostasis. The C6-7 disc was opened with the bovie. Curettes and pituitary rongeurs used to remove the majority of disk, then the drill was used to remove the posterior osteophyte and begin the foraminotomies. The nerve hook was used to elevate the posterior longitudinal ligament, which was then removed with Kerrison rongeurs to complete decompression of the spinal cord. The Kerrison rongeurs were then used to complete the foraminotomies bilaterally to decompress the nerve roots. The nerve hook could be passed out each foramen, ensuring decompression of the nerve roots. Meticulous hemostasis was obtained. A biomechanical device (Globus Hedron C 7 mm height x 14 mm width by 12 mm depth) was placed at C6/7. The device had been filled with demineralized bone matrix for aid in arthrodesis.  Please note that the procedure included removal of the disc, removal of the posterior osteophytes, and removal of the posterior longitudinal ligament to ensure decompression of  the spinal cord.  Additionally, foraminotomies were performed on both sides of the spinal canal to decompress the nerve roots.   A four segment, three level plate (45 mm Globus Xtend) was chosen.  Two screws placed in the vertebral bodies of all four segments, respectively making sure the screws were behind the locking mechanism.  Final AP and lateral radiographs were taken.   Please note that the plate is not inclusive to the biomechanical devices.  The anchoring mechanism of the plate is completely separate from the biomechanical devices.  With everything in good position, the wound was irrigated copiously with bacitracin-containing solution and meticulous hemostasis obtained.  Wound was closed in 2 layers using interrupted inverted 3-0 Vicryl sutures.  The wound was dressed with dermabond, the head of bed at 30 degrees, taken to recovery room in stable condition.  No new postop neurological deficits were identified..  All counts were correct at the end of the case.  I performed the entire procedure with the assistance of Manning Charity PA as an Designer, television/film set.An assistant was required for this procedure due to the complexity.  The assistant provided assistance in tissue manipulation and suction, and was required for the successful and safe performance of the procedure. I performed the critical portions of the procedure.   Venetia Night MD

## 2022-02-08 NOTE — Interval H&P Note (Signed)
History and Physical Interval Note:  02/08/2022 3:09 PM  Anna Andersen  has presented today for surgery, with the diagnosis of G95.9 Cervical myelopathy M48.02 Cervical stenosis of spinal canal M54.12 Cervical radiculopathy.  The various methods of treatment have been discussed with the patient and family. After consideration of risks, benefits and other options for treatment, the patient has consented to  Procedure(s): C4-7 ANTERIOR CERVICAL DISCECTOMY AND FUSION (GLOBUS HEDRON) (N/A) as a surgical intervention.  The patient's history has been reviewed, patient examined, no change in status, stable for surgery.  I have reviewed the patient's chart and labs.  Questions were answered to the patient's satisfaction.    Heart sounds normal no MRG. Chest Clear to Auscultation Bilaterally.  [name]

## 2022-02-08 NOTE — Discharge Instructions (Signed)
Your surgeon has performed an operation on your cervical spine (neck) to relieve pressure on the spinal cord and/or nerves. This involved making an incision in the front of your neck and removing one or more of the discs that support your spine. Next, a small piece of bone, a titanium plate, and screws were used to fuse two or more of the vertebrae (bones) together.  The following are instructions to help in your recovery once you have been discharged from the hospital. Even if you feel well, it is important that you follow these activity guidelines. If you do not let your neck heal properly from the surgery, you can increase the chance of return of your symptoms and other complications.  * Do not take anti-inflammatory medications for 3 months after surgery (naproxen [Aleve], ibuprofen [Advil, Motrin], etc.). These medications can prevent your bones from healing properly.  Celebrex, if prescribed, is ok to take.  Activity    No bending, lifting, or twisting ("BLT"). Avoid lifting objects heavier than 10 pounds (gallon milk jug).  Where possible, avoid household activities that involve lifting, bending, reaching, pushing, or pulling such as laundry, vacuuming, grocery shopping, and childcare. Try to arrange for help from friends and family for these activities while your back heals.  Increase physical activity slowly as tolerated.  Taking short walks is encouraged, but avoid strenuous exercise. Do not jog, run, bicycle, lift weights, or participate in any other exercises unless specifically allowed by your doctor.  Talk to your doctor before resuming sexual activity.  You should not drive until cleared by your doctor.  Until released by your doctor, you should not return to work or school.  You should rest at home and let your body heal.   You may shower three days after your surgery.  After showering, lightly dab your incision dry. Do not take a tub bath or go swimming until approved by your  doctor at your follow-up appointment.  If your doctor ordered a cervical collar (neck brace) for you, you should wear it whenever you are out of bed. You may remove it when lying down or sleeping, but you should wear it at all other times. Not all neck surgeries require a cervical collar.  If you smoke, we strongly recommend that you quit.  Smoking has been proven to interfere with normal bone healing and will dramatically reduce the success rate of your surgery. Please contact [facility] (800-QUIT-NOW) and use the resources at www.[facility].com for assistance in stopping smoking.  Surgical Incision   If you have a dressing on your incision, you may remove it two days after your surgery. Keep your incision area clean and dry.  Your incision was closed with Dermabond glue. The glue should begin to peel away within about a week Diet           You may return to your usual diet. However, you may experience discomfort when swallowing in the first month after your surgery. This is normal. You may find that softer foods are more comfortable for you to swallow. Be sure to stay hydrated.  When to Contact us  You may experience pain in your neck and/or pain between your shoulder blades. This is normal and should improve in the next few weeks with the help of pain medication, muscle relaxers, and rest. Some patients report that a warm compress on the back of the neck or between the shoulder blades helps.  However, should you experience any of the following, contact us immediately:  New numbness or weakness Pain that is progressively getting worse, and is not relieved by your pain medication, muscle relaxers, rest, and warm compresses Bleeding, redness, swelling, pain, or drainage from surgical incision Chills or flu-like symptoms Fever greater than 101.0 F (38.3 C) Inability to eat, drink fluids, or take medications Problems with bowel or bladder functions Difficulty breathing or shortness of  breath Warmth, tenderness, or swelling in your calf Contact Information During office hours (Monday-Friday 9 am to 5 pm), please call your physician at [phone] and ask for Berdine Addison After hours and weekends, please call [phone] and speak with the neurosurgeon on call For a life-threatening emergency, call 911

## 2022-02-08 NOTE — Anesthesia Preprocedure Evaluation (Addendum)
Anesthesia Evaluation  Patient identified by MRN, date of birth, ID band Patient awake    Reviewed: Allergy & Precautions, NPO status , Patient's Chart, lab work & pertinent test results  History of Anesthesia Complications Negative for: history of anesthetic complications  Airway Mallampati: II  TM Distance: >3 FB Neck ROM: full    Dental no notable dental hx.    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  Past Surgical History: age 56: ADENOIDECTOMY W/ MYRINGOTOMY [phone]: CESAREAN SECTION 2019: COLONOSCOPY No date: ENDOMETRIAL ABLATION 06/16/2020: ROBOTIC ASSISTED LAPAROSCOPIC HYSTERECTOMY AND  SALPINGECTOMY; Bilateral     Comment:  Procedure: XI ROBOTIC ASSISTED LAPAROSCOPIC HYSTERECTOMY              AND SALPINGECTOMY;  Surgeon: Rubie Maid, MD;                Location: [facility] ORS;  Service: Gynecology;  Laterality:               Bilateral; 1997: TUBAL LIGATION 2006: WRIST SURGERY; Right     Comment:  bone spur  BMI    Body Mass Index: 26.07 kg/m      Reproductive/Obstetrics negative OB ROS                              Anesthesia Physical Anesthesia Plan  ASA: 2  Anesthesia Plan: General ETT   Post-op Pain Management: Toradol IV (intra-op)*, Ofirmev IV (intra-op)* and Dilaudid IV   Induction:  Intravenous  PONV Risk Score and Plan: Ondansetron, Dexamethasone, Midazolam and Treatment may vary due to age or medical condition  Airway Management Planned: Oral ETT  Additional Equipment:   Intra-op Plan:   Post-operative Plan: Extubation in OR  Informed Consent: I have reviewed the patients History and Physical, chart, labs and discussed the procedure including the risks, benefits and alternatives for the proposed anesthesia with the patient or authorized representative who has indicated his/her understanding and acceptance.     Dental Advisory Given  Plan Discussed with: Anesthesiologist, CRNA and Surgeon  Anesthesia Plan Comments: (Patient consented for risks of anesthesia including but not limited to:  - adverse reactions to medications - damage to eyes, teeth, lips or other oral mucosa - nerve damage due to positioning  - sore throat or hoarseness - Damage to heart, brain, nerves, lungs, other parts of body or loss of life  Patient voiced understanding.)         Anesthesia Quick Evaluation

## 2022-02-08 NOTE — Anesthesia Postprocedure Evaluation (Signed)
Anesthesia Post Note  Patient: Anna Andersen  Procedure(s) Performed: C4-7 ANTERIOR CERVICAL DISCECTOMY AND FUSION (GLOBUS HEDRON) (Neck)  Patient location during evaluation: PACU Anesthesia Type: General Level of consciousness: awake and alert Pain management: pain level controlled Vital Signs Assessment: post-procedure vital signs reviewed and stable Respiratory status: spontaneous breathing, nonlabored ventilation and respiratory function stable Cardiovascular status: blood pressure returned to baseline and stable Postop Assessment: no apparent nausea or vomiting Anesthetic complications: no   No notable events documented.   Last Vitals:  Vitals:   02/08/22 1900 02/08/22 1915  BP: 127/85   Pulse: 88   Resp: 20   Temp:  (!) 36.2 C  SpO2: 97%     Last Pain:  Vitals:   02/08/22 1915  TempSrc:   PainSc: [place]

## 2022-02-08 NOTE — Anesthesia Procedure Notes (Signed)
Procedure Name: Intubation Date/Time: 02/08/2022 3:59 PM  Performed by: Cammie Sickle, CRNAPre-anesthesia Checklist: Patient identified, Patient being monitored, Timeout performed, Emergency Drugs available and Suction available Patient Re-evaluated:Patient Re-evaluated prior to induction Oxygen Delivery Method: Circle system utilized Preoxygenation: Pre-oxygenation with 100% oxygen Induction Type: IV induction Ventilation: Mask ventilation without difficulty Laryngoscope Size: 3 and McGraph Grade View: Grade I Tube type: Oral Tube size: 6.5 mm Number of attempts: 1 Airway Equipment and Method: Stylet Placement Confirmation: ETT inserted through vocal cords under direct vision, positive ETCO2 and breath sounds checked- equal and bilateral Secured at: 22 cm Tube secured with: Tape Dental Injury: Teeth and Oropharynx as per pre-operative assessment

## 2022-02-09 ENCOUNTER — Encounter: Payer: Self-pay | Admitting: Neurosurgery

## 2022-02-09 DIAGNOSIS — M4802 Spinal stenosis, cervical region: Secondary | ICD-10-CM | POA: Diagnosis not present

## 2022-02-09 MED ORDER — METHOCARBAMOL 500 MG PO TABS: 500.0000 mg | ORAL_TABLET | Freq: Four times a day (QID) | ORAL | 0 refills | Status: DC | PRN

## 2022-02-09 MED ORDER — OXYCODONE HCL 5 MG PO TABS: 5.0000 mg | ORAL_TABLET | ORAL | 0 refills | Status: DC | PRN

## 2022-02-09 NOTE — Plan of Care (Signed)
  Problem: Activity: Goal: Ability to avoid complications of mobility impairment will improve Outcome: Progressing   Problem: Pain Management: Goal: Pain level will decrease Outcome: Progressing   Problem: Skin Integrity: Goal: Will show signs of wound healing Outcome: Progressing

## 2022-02-09 NOTE — Progress Notes (Signed)
1117 D/C AVS completed and reviewed with pt. All opportunities for questions answered and clarified. IV removed. Pt will be wheeled down to car at medical mall entrance via wheelchair.

## 2022-02-09 NOTE — Evaluation (Signed)
Physical Therapy Evaluation Patient Details Name: Anna Andersen MRN: [id number] DOB: 10-02-1965 Today's Date: 02/09/2022  History of Present Illness  Pt is a 56 y.o. female s/p C4-C7 ACDF on 02/08/22.  Clinical Impression  Pt admitted with above diagnosis. Pt received upright In recliner agreeable to PT session. Pt reports at baseline she is independent with all mobility and ADL's/IADL's.   TO date pt stands independently and ambulates with supervision ~200'. Mild R?L drifting that improves with gait. Safe completion of stairs performed alternating pattern ascending and step to descending with single rail use. Pt understanding on log roll technique education, BLT precautions, and orders for wearing Cervical collar. Pt with WNL sensation to light touch in UE and LE dermatomes. Pt back in recliner with all needs in reach. No acute PT needs, no DME needs or f/u recs needed at this time. PT to discharge pt at this time. Medical team updated.     Recommendations for follow up therapy are one component of a multi-disciplinary discharge planning process, led by the attending physician.  Recommendations may be updated based on patient status, additional functional criteria and insurance authorization.  Follow Up Recommendations No PT follow up      Assistance Recommended at Discharge Intermittent Supervision/Assistance  Patient can return home with the following  A little help with walking and/or transfers;Help with stairs or ramp for entrance;Assist for transportation;Assistance with cooking/housework    Equipment Recommendations None recommended by PT  Recommendations for Other Services       Functional Status Assessment Patient has had a recent decline in their functional status and demonstrates the ability to make significant improvements in function in a reasonable and predictable amount of time.     Precautions / Restrictions Precautions Precautions: Cervical Precaution Booklet  Issued: No Precaution Comments: Limit BLT Required Braces or Orthoses: Cervical Brace Cervical Brace: Soft collar;Other (comment) (Per orders does not need to wear walking to bathroom, laying in bed. To don/doff in sitting.) Restrictions Weight Bearing Restrictions: No Other Position/Activity Restrictions: Limit BLT      Mobility  Bed Mobility               General bed mobility comments: NT. In recliner. Patient Response: Cooperative  Transfers Overall transfer level: Needs assistance Equipment used: None Transfers: Sit to/from Stand Sit to Stand: Independent                Ambulation/Gait Ambulation/Gait assistance: Supervision Gait Distance (Feet): 200 Feet Assistive device: None Gait Pattern/deviations: Step-through pattern, Decreased step length - right, Decreased step length - left, Drifts right/left       General Gait Details: Mild R/L drifting during gait. No LOB, with pt correct drifting that improves with prolonged walking.  Stairs Stairs: Yes Stairs assistance: Supervision Stair Management: One rail Left, Alternating pattern, Step to pattern, Forwards Number of Stairs: 4 General stair comments: asc/desc with single rail use. Alternating pattern ascending and step to pattern descending. Safe technique, no need for pushing/pulling on rail. Mild use for steadying.  Wheelchair Mobility    Modified Rankin (Stroke Patients Only)       Balance Overall balance assessment: Needs assistance Sitting-balance support: Feet supported, No upper extremity supported Sitting balance-Leahy Scale: Good     Standing balance support: No upper extremity supported Standing balance-Leahy Scale: Fair                               Pertinent Vitals/Pain  Pain Assessment Pain Assessment: Faces Faces Pain Scale: Hurts a little bit Pain Location: incisional Pain Descriptors / Indicators: Discomfort Pain Intervention(s): Limited activity within  patient's tolerance, Monitored during session, Repositioned    Home Living Family/patient expects to be discharged to:: Private residence Living Arrangements: Spouse/significant other Available Help at Discharge: Available 24 hours/day Type of Home: Mobile home Home Access: Stairs to enter Entrance Stairs-Rails: Left Entrance Stairs-Number of Steps: 5   Home Layout: One level Home Equipment: Shower seat - built in      Prior Function Prior Level of Function : Independent/Modified Independent                     Hand Dominance   Dominant Hand: Right    Extremity/Trunk Assessment   Upper Extremity Assessment Upper Extremity Assessment: Defer to OT evaluation    Lower Extremity Assessment Lower Extremity Assessment: Overall WFL for tasks assessed    Cervical / Trunk Assessment Cervical / Trunk Assessment: Neck Surgery  Communication   Communication: No difficulties  Cognition Arousal/Alertness: Awake/alert Behavior During Therapy: WFL for tasks assessed/performed Overall Cognitive Status: Within Functional Limits for tasks assessed                                          General Comments      Exercises Other Exercises Other Exercises: C collar orders, PT recs, BLT precautions, safe use of stairs   Assessment/Plan    PT Assessment Patient does not need any further PT services  PT Problem List         PT Treatment Interventions      PT Goals (Current goals can be found in the Care Plan section)  Acute Rehab PT Goals PT Goal Formulation: All assessment and education complete, DC therapy    Frequency       Co-evaluation               AM-PAC PT "6 Clicks" Mobility  Outcome Measure Help needed turning from your back to your side while in a flat bed without using bedrails?: A Little Help needed moving from lying on your back to sitting on the side of a flat bed without using bedrails?: A Little Help needed moving to and  from a bed to a chair (including a wheelchair)?: A Little Help needed standing up from a chair using your arms (e.g., wheelchair or bedside chair)?: None Help needed to walk in hospital room?: None Help needed climbing 3-5 steps with a railing? : A Little 6 Click Score: 20    End of Session Equipment Utilized During Treatment: Gait belt Activity Tolerance: Patient tolerated treatment well Patient left: in chair Nurse Communication: Mobility status      Time: 7035-0093 PT Time Calculation (min) (ACUTE ONLY): 20 min   Charges:   PT Evaluation $PT Eval Low Complexity: 1 Low        Omarr Hann M. Fairly IV, PT, DPT Physical Therapist- [facility]  02/09/2022, 10:54 AM

## 2022-02-09 NOTE — Evaluation (Signed)
Occupational Therapy Evaluation Patient Details Name: Anna Andersen MRN: [id number] DOB: 10/20/1965 Today's Date: 02/09/2022   History of Present Illness Pt is a 56 y.o. female s/p C4-C7 ACDF on 02/08/22.   Clinical Impression   Patient seen for OT evaluation, significant other present. Prior to admission pt lived with SO, was independent for ADLs/IADLs, still driving, and working. Pt is currently functioning at Mod I overall for ADLs, IND for toilet transfer, and supervision for functional mobility to the bathroom without AD. OT provided education re: no BLT, log roll technique for bed mobility, and use of reacher for LB dressing. Pt able to don/doff socks and mesh underwear without AE. At this time, pt does not demonstrate any acute OT needs. Will complete orders.    Recommendations for follow up therapy are one component of a multi-disciplinary discharge planning process, led by the attending physician.  Recommendations may be updated based on patient status, additional functional criteria and insurance authorization.   Follow Up Recommendations  No OT follow up     Assistance Recommended at Discharge Intermittent Supervision/Assistance  Patient can return home with the following A little help with walking and/or transfers;A little help with bathing/dressing/bathroom;Assistance with cooking/housework;Assist for transportation;Help with stairs or ramp for entrance    Functional Status Assessment  Patient has had a recent decline in their functional status and demonstrates the ability to make significant improvements in function in a reasonable and predictable amount of time.  Equipment Recommendations  None recommended by OT    Recommendations for Other Services       Precautions / Restrictions Precautions Precautions: Cervical Precaution Booklet Issued: No Precaution Comments: Limit BLT Required Braces or Orthoses: Cervical Brace Cervical Brace: Soft collar;Other (comment)  (Per orders does not need to wear walking to bathroom, laying in bed. To don/doff in sitting.) Restrictions Weight Bearing Restrictions: No Other Position/Activity Restrictions: Limit BLT      Mobility Bed Mobility               General bed mobility comments: NT. In recliner.    Transfers Overall transfer level: Needs assistance Equipment used: None Transfers: Sit to/from Stand Sit to Stand: Independent                  Balance Overall balance assessment: Needs assistance Sitting-balance support: Feet supported, No upper extremity supported Sitting balance-Leahy Scale: Good     Standing balance support: No upper extremity supported Standing balance-Leahy Scale: Fair                             ADL either performed or assessed with clinical judgement   ADL Overall ADL's : Modified independent                                       General ADL Comments: Pt completed functional mobility to the bathroom with supervision and no AD. She completed toilet transfer, clothing management, peri care, sinkside grooming, and LB dressing with Mod I.     Vision Baseline Vision/History: 1 Wears glasses Patient Visual Report: No change from baseline       Perception     Praxis      Pertinent Vitals/Pain Pain Assessment Pain Assessment: Faces Faces Pain Scale: Hurts a little bit Pain Location: incision site Pain Descriptors / Indicators: Discomfort Pain Intervention(s): Limited activity within patient's tolerance,  Monitored during session, Repositioned     Hand Dominance Right   Extremity/Trunk Assessment Upper Extremity Assessment Upper Extremity Assessment: Overall WFL for tasks assessed   Lower Extremity Assessment Lower Extremity Assessment: Overall WFL for tasks assessed   Cervical / Trunk Assessment Cervical / Trunk Assessment: Neck Surgery   Communication Communication Communication: No difficulties   Cognition  Arousal/Alertness: Awake/alert Behavior During Therapy: WFL for tasks assessed/performed Overall Cognitive Status: Within Functional Limits for tasks assessed                                       General Comments       Exercises Other Exercises Other Exercises: OT provided education re: role of OT, OT POC, post acute recs, sitting up for all meals, EOB/OOB mobility with assistance, home/fall safety, no BLT, log roll technique for bed mobility, LB dressing adaptive equipment (reacher)   Shoulder Instructions      Home Living Family/patient expects to be discharged to:: Private residence Living Arrangements: Spouse/significant other Available Help at Discharge: Available 24 hours/day Type of Home: Mobile home Home Access: Stairs to enter [employer] of Steps: 5 Entrance Stairs-Rails: Left Home Layout: One level     Bathroom Shower/Tub: Occupational psychologist: Standard Bathroom Accessibility: Yes   Home Equipment: Shower seat - built in;Grab bars - tub/shower          Prior Functioning/Environment Prior Level of Function : Independent/Modified Independent;Driving;Working/employed                        OT Problem List:        OT Treatment/Interventions:      OT Goals(Current goals can be found in the care plan section) Acute Rehab OT Goals Patient Stated Goal: go home OT Goal Formulation: All assessment and education complete, DC therapy  OT Frequency:      Co-evaluation              AM-PAC OT "6 Clicks" Daily Activity     Outcome Measure Help from another person eating meals?: None Help from another person taking care of personal grooming?: None Help from another person toileting, which includes using toliet, bedpan, or urinal?: None Help from another person bathing (including washing, rinsing, drying)?: A Little Help from another person to put on and taking off regular upper body clothing?: None Help from  another person to put on and taking off regular lower body clothing?: None 6 Click Score: 23   End of Session Nurse Communication: Mobility status  Activity Tolerance: Patient tolerated treatment well Patient left: in chair;with call bell/phone within reach;with family/visitor present                   Time: 8341-9622 OT Time Calculation (min): 10 min Charges:  OT General Charges $OT Visit: 1 Visit OT Evaluation $OT Eval Low Complexity: 1 Low  [facility] MS, OTR/L ascom [phone]  02/09/22, 11:44 AM

## 2022-02-09 NOTE — Progress Notes (Signed)
    Attending Progress Note  History: Anna Andersen is s/p ACDF for cervical myelopathy   POD1: expected neck pain. Pt reports improved arm pain  Physical Exam: Vitals:   02/09/22 0411 02/09/22 0744  BP: 136/85 125/78  Pulse: 69 62  Resp: 16   Temp: 97.8 F (36.6 C) 97.7 F (36.5 C)  SpO2: 100% 99%    AA Ox3 CNI  Strength: Side Biceps Triceps Deltoid Interossei Grip Wrist Ext. Wrist Flex.  R 5 4+ 5 5 4+ 5 5  L 4 5 4- 5 4+ 5 5   Incision c/d/I with dermabond in place  Data:  Other tests/results: none  Assessment/Plan:  Anna Andersen is a 56 y.o presenting with cervical myelopathy and radiculopathy s/p C4-7 ACDF.  - mobilize - pain control - DVT prophylaxis - PTOT; dispo planning underway.   Cooper Render PA-C Department of Neurosurgery

## 2022-02-09 NOTE — Discharge Summary (Signed)
Discharge Summary  Patient ID: Anna Andersen MRN: [id number] DOB/AGE: 1965/12/11 56 y.o.  Admit date: 02/08/2022 Discharge date: 02/09/2022  Admission Diagnoses: G95.9 Cervical myelopathy, M48.02 Cervical stenosis of spinal canal, M54.12 Cervical radiculopathy   Discharge Diagnoses:  Principal Problem:   S/P cervical spinal fusion Active Problems:   Cervical myelopathy (HCC)   Cervical spinal stenosis   Cervical radiculopathy   Discharged Condition: good  Hospital Course:  Anna Andersen is a 56 y.o presenting with cervical myelopathy and radiculopathy s/p C4-7 ACDF. Her intraoperative course was uncomplicated. She was admitted for pain control, therapy evaluation, and monitoring overnight. She was seen by therapy and deemed appropriate for discharge home. She was given prescriptions for Oxycodone and Robaxin with plans to follow up for post-op visit in 2 weeks.  Consults: None  Significant Diagnostic Studies: none  Treatments: surgery: as above. Please see separately dictated operative report for further details  Discharge Exam: Blood pressure 125/78, pulse 62, temperature 97.7 F (36.5 C), resp. rate 16, height '5\' 1"'$  (1.549 m), weight 62.6 kg, last menstrual period 04/22/2019, SpO2 99 %. A&O    Strength: Side Biceps Triceps Deltoid Interossei Grip Wrist Ext. Wrist Flex.  R 5 4+ 5 5 4+ 5 5  L 4 5 4- 5 4+ 5 5   Incisionc /d/I with dermabond in place Disposition: Discharge disposition: 01-Home or Self Care       Discharge Instructions     Diet - low sodium heart healthy   Complete by: As directed    Incentive spirometry RT   Complete by: As directed    No dressing needed   Complete by: As directed       Allergies as of 02/09/2022       Reactions   Pravastatin Other (See Comments)   Joint pain   Gluten Meal Itching, Rash        Medication List     STOP taking these medications    BLACK COHOSH PO   cyclobenzaprine 5 MG tablet Commonly  known as: FLEXERIL   ESTROVEN PO       TAKE these medications    methocarbamol 500 MG tablet Commonly known as: ROBAXIN Take 1 tablet (500 mg total) by mouth every 6 (six) hours as needed for muscle spasms.   oxyCODONE 5 MG immediate release tablet Commonly known as: Oxy IR/ROXICODONE Take 1 tablet (5 mg total) by mouth every 3 (three) hours as needed for moderate pain ((score 4 to 6)).   propranolol 80 MG tablet Commonly known as: INDERAL Take 80 mg by mouth 2 (two) times daily.   rosuvastatin 20 MG tablet Commonly known as: CRESTOR Take 20 mg by mouth every morning.               Discharge Care Instructions  (From admission, onward)           Start     Ordered   02/09/22 0000  No dressing needed        02/09/22 1049             Signed: Loleta Dicker 02/09/2022, 10:53 AM

## 2022-02-23 ENCOUNTER — Encounter: Payer: Self-pay | Admitting: Neurosurgery

## 2022-02-23 ENCOUNTER — Ambulatory Visit (INDEPENDENT_AMBULATORY_CARE_PROVIDER_SITE_OTHER): Payer: Managed Care, Other (non HMO) | Admitting: Neurosurgery

## 2022-02-23 VITALS — BP 130/82 | Temp 98.2°F | Ht 61.0 in | Wt 138.0 lb

## 2022-02-23 DIAGNOSIS — M5412 Radiculopathy, cervical region: Secondary | ICD-10-CM

## 2022-02-23 DIAGNOSIS — M4802 Spinal stenosis, cervical region: Secondary | ICD-10-CM

## 2022-02-23 DIAGNOSIS — Z981 Arthrodesis status: Secondary | ICD-10-CM

## 2022-02-23 DIAGNOSIS — G959 Disease of spinal cord, unspecified: Secondary | ICD-10-CM

## 2022-02-23 DIAGNOSIS — Z09 Encounter for follow-up examination after completed treatment for conditions other than malignant neoplasm: Secondary | ICD-10-CM

## 2022-02-23 MED ORDER — CELECOXIB 200 MG PO CAPS: 200.0000 mg | ORAL_CAPSULE | Freq: Two times a day (BID) | ORAL | 0 refills | Status: DC | PRN

## 2022-02-23 NOTE — Progress Notes (Signed)
   REFERRING PHYSICIAN:  Idelle Crouch, [address] [place],  [place] [address]  DOS: 02/08/22 C4-7 ACDF  HISTORY OF PRESENT ILLNESS: Anna Andersen is about 2 weeks status post ACDF. Overall, she is doing well postoperatively.  She reports resolution of her radiating arm pain!  She does admit to some persistent her middle finger on the right.  PHYSICAL EXAMINATION:  NEUROLOGICAL:  General: In no acute distress.   Awake, alert, oriented to person, place, and time.  Pupils equal round and reactive to light.  Facial tone is symmetric.  Tongue protrusion is midline.  There is no pronator drift.  Strength: Side Biceps Triceps Deltoid Interossei Grip Wrist Ext. Wrist Flex.  R '5 5 5 5 5 5 5  '$ L '5 5 5 5 5 5 5   '$ Incision c/d/I and healing well  Imaging:  No interval imaging to review today  Assessment / Plan: Anna Andersen is doing well after ACDF.  As requested a prescription for Celebrex.  I will prescribe for this today.  We discussed activity escalation and I have advised the patient to lift up to 10 pounds until 6 weeks after surgery, then increase up to 25 pounds until 12 weeks after surgery.  After 12 weeks post-op, the patient advised to increase activity as tolerated. she will return to clinic in approximately weeks with x-rays prior.  She was encouraged to call the office in the interim should she have any questions or concerns.  Anna Render PA-C Dept of Neurosurgery

## 2022-03-02 ENCOUNTER — Other Ambulatory Visit: Payer: Self-pay | Admitting: Neurosurgery

## 2022-03-22 ENCOUNTER — Other Ambulatory Visit: Payer: Self-pay

## 2022-03-22 DIAGNOSIS — Z981 Arthrodesis status: Secondary | ICD-10-CM

## 2022-03-23 ENCOUNTER — Ambulatory Visit (INDEPENDENT_AMBULATORY_CARE_PROVIDER_SITE_OTHER): Payer: Managed Care, Other (non HMO) | Admitting: Neurosurgery

## 2022-03-23 ENCOUNTER — Ambulatory Visit
Admission: RE | Admit: 2022-03-23 | Discharge: 2022-03-23 | Disposition: A | Discharge status: Home / Self Care | Payer: Managed Care, Other (non HMO) | Attending: Neurosurgery | Admitting: Neurosurgery

## 2022-03-23 ENCOUNTER — Encounter: Payer: Self-pay | Admitting: Neurosurgery

## 2022-03-23 ENCOUNTER — Ambulatory Visit
Admission: RE | Admit: 2022-03-23 | Discharge: 2022-03-23 | Disposition: A | Discharge status: Home / Self Care | Payer: Managed Care, Other (non HMO) | Source: Ambulatory Visit | Attending: Neurosurgery | Admitting: Neurosurgery

## 2022-03-23 VITALS — BP 163/96 | HR 122 | Temp 98.1°F | Ht 61.0 in | Wt 142.8 lb

## 2022-03-23 DIAGNOSIS — Z981 Arthrodesis status: Secondary | ICD-10-CM | POA: Insufficient documentation

## 2022-03-23 DIAGNOSIS — G959 Disease of spinal cord, unspecified: Secondary | ICD-10-CM

## 2022-03-23 DIAGNOSIS — Z09 Encounter for follow-up examination after completed treatment for conditions other than malignant neoplasm: Secondary | ICD-10-CM

## 2022-03-23 DIAGNOSIS — M4802 Spinal stenosis, cervical region: Secondary | ICD-10-CM

## 2022-03-23 NOTE — Progress Notes (Signed)
   REFERRING PHYSICIAN:  Idelle Crouch, [address] [place],  [place] [address]  DOS: 02/08/22 C4-7 ACDF  HISTORY OF PRESENT ILLNESS: Anna Andersen is status post ACDF.  She has had significant improvement of her arm pain.  She does have some persistent numbness in her right hand.    PHYSICAL EXAMINATION:  NEUROLOGICAL:  General: In no acute distress.   Awake, alert, oriented to person, place, and time.  Pupils equal round and reactive to light.  Facial tone is symmetric.  Tongue protrusion is midline.  There is no pronator drift.  Strength: Side Biceps Triceps Deltoid Interossei Grip Wrist Ext. Wrist Flex.  R '5 5 5 5 5 5 5  '$ L '5 5 5 5 5 5 5   '$ Incision c/d/I and healing well  Imaging:  No complications noted   assessment / Plan: Anna Andersen is doing well after ACDF.   I am very pleased with her improvements.  Will start exercises for her neck today.  She remains out of work for now.  I think her sensory changes will improve over the coming several months.  I think her swallowing will continue to improve.  She is now on a 25 pound lifting limit.  Have cleared her for low impact exercise.  Dept of Neurosurgery

## 2022-03-24 ENCOUNTER — Telehealth: Payer: Self-pay

## 2022-03-24 MED ORDER — CYCLOBENZAPRINE HCL 5 MG PO TABS: 5.0000 mg | ORAL_TABLET | Freq: Three times a day (TID) | ORAL | 0 refills | Status: AC | PRN

## 2022-03-24 NOTE — Telephone Encounter (Signed)
-----  Message from Peggyann Shoals sent at 03/24/2022 10:17 AM EST ----- Regarding: med refill C4-7 ACDF on 02/08/22//xrays today She saw Dr.Yarbrough yesterday and he was going to to refill flexeril. [facility] did not receive rx. Can you please send it today.

## 2022-03-24 NOTE — Telephone Encounter (Signed)
Patty spoke with the patient. She hasn't been taking methocarbamol. She was taking flexeril from Dr Doy Hutching before surgery and feels that helps better. She also told Patty she has been taking tylenol and ibuprofen.

## 2022-03-24 NOTE — Telephone Encounter (Signed)
Per discussion with Marzetta Board, I sent rx for flexeril.  I notified the patient and reminded her not to take the methocarbamol. I also reminded her not to take ibuprofen or any NSAIDS (except celebrex) until she is 3 months out from surgery. She feels tylenol helps better than ibuprofen and will continue using tylenol. I encouraged her to call if she has any further needs or concerns.

## 2022-04-15 ENCOUNTER — Telehealth: Payer: Self-pay

## 2022-04-15 NOTE — Telephone Encounter (Signed)
-----   Message from Peggyann Shoals sent at 04/15/2022 10:26 AM EST ----- Regarding: work note Contact: [phone] C4-7 ACDF on 02/08/22 She is going back to work on 04/19/22. 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 That way if she needs a brake she can lay down. Her body needs to get used to sitting in a chair for 8 hours straight. She will return to work without restrictions on 04/26/2022. Once letter is done we need to fax it to [place] fax [phone].

## 2022-04-15 NOTE — Telephone Encounter (Signed)
Ok for work note? 

## 2022-04-16 NOTE — Telephone Encounter (Signed)
Left her a message on her voice mail that the work not has been faxed.

## 2022-04-16 NOTE — Telephone Encounter (Signed)
Note has been typed, please fax.

## 2022-05-03 ENCOUNTER — Other Ambulatory Visit: Payer: Self-pay

## 2022-05-03 DIAGNOSIS — Z981 Arthrodesis status: Secondary | ICD-10-CM

## 2022-05-04 ENCOUNTER — Encounter: Payer: Self-pay | Admitting: Neurosurgery

## 2022-05-04 ENCOUNTER — Ambulatory Visit: Payer: Managed Care, Other (non HMO) | Admitting: Neurosurgery

## 2022-05-04 ENCOUNTER — Ambulatory Visit
Admission: RE | Admit: 2022-05-04 | Discharge: 2022-05-04 | Disposition: A | Discharge status: Home / Self Care | Payer: Managed Care, Other (non HMO) | Source: Ambulatory Visit | Attending: Neurosurgery | Admitting: Neurosurgery

## 2022-05-04 ENCOUNTER — Ambulatory Visit
Admission: RE | Admit: 2022-05-04 | Discharge: 2022-05-04 | Disposition: A | Discharge status: Home / Self Care | Payer: Managed Care, Other (non HMO) | Attending: Neurosurgery | Admitting: Neurosurgery

## 2022-05-04 VITALS — BP 122/73 | HR 73 | Ht 61.0 in | Wt 142.0 lb

## 2022-05-04 DIAGNOSIS — Z981 Arthrodesis status: Secondary | ICD-10-CM | POA: Insufficient documentation

## 2022-05-04 DIAGNOSIS — M5412 Radiculopathy, cervical region: Secondary | ICD-10-CM

## 2022-05-04 DIAGNOSIS — Z09 Encounter for follow-up examination after completed treatment for conditions other than malignant neoplasm: Secondary | ICD-10-CM

## 2022-05-04 DIAGNOSIS — G959 Disease of spinal cord, unspecified: Secondary | ICD-10-CM

## 2022-05-04 DIAGNOSIS — M4802 Spinal stenosis, cervical region: Secondary | ICD-10-CM

## 2022-05-04 NOTE — Progress Notes (Signed)
   REFERRING PHYSICIAN:  Idelle Crouch, [address],  [place] [address]  DOS: 02/08/22 C4-7 ACDF  HISTORY OF PRESENT ILLNESS: Anna Andersen is status post ACDF.  She has had significant improvement of her arm pain.  Her numbness has improved but is still present.   PHYSICAL EXAMINATION:  NEUROLOGICAL:  General: In no acute distress.   Awake, alert, oriented to person, place, and time.  Pupils equal round and reactive to light.  Facial tone is symmetric.  Tongue protrusion is midline.  There is no pronator drift.  Strength: Side Biceps Triceps Deltoid Interossei Grip Wrist Ext. Wrist Flex.  R 5 5 5 5 5 5 5   L 5 5 5 5 5 5 5    Incision c/d/I and healing well  Imaging:  No complications noted   assessment / Plan: Anna Andersen is doing well after ACDF.   I am very pleased with her improvements.  She is off restrictions.  Will see her back in 6 months with xrays.  Meade Maw Dept of Neurosurgery

## 2022-06-02 IMAGING — CR DG CERVICAL SPINE 2 OR 3 VIEWS
3 series · 3 of 3 positions shown · non-contrast
Comparison: None.

CLINICAL DATA: Worsening posterior right shoulder pain.

EXAM:
CERVICAL SPINE - 2-3 VIEW

[c-spine lat]
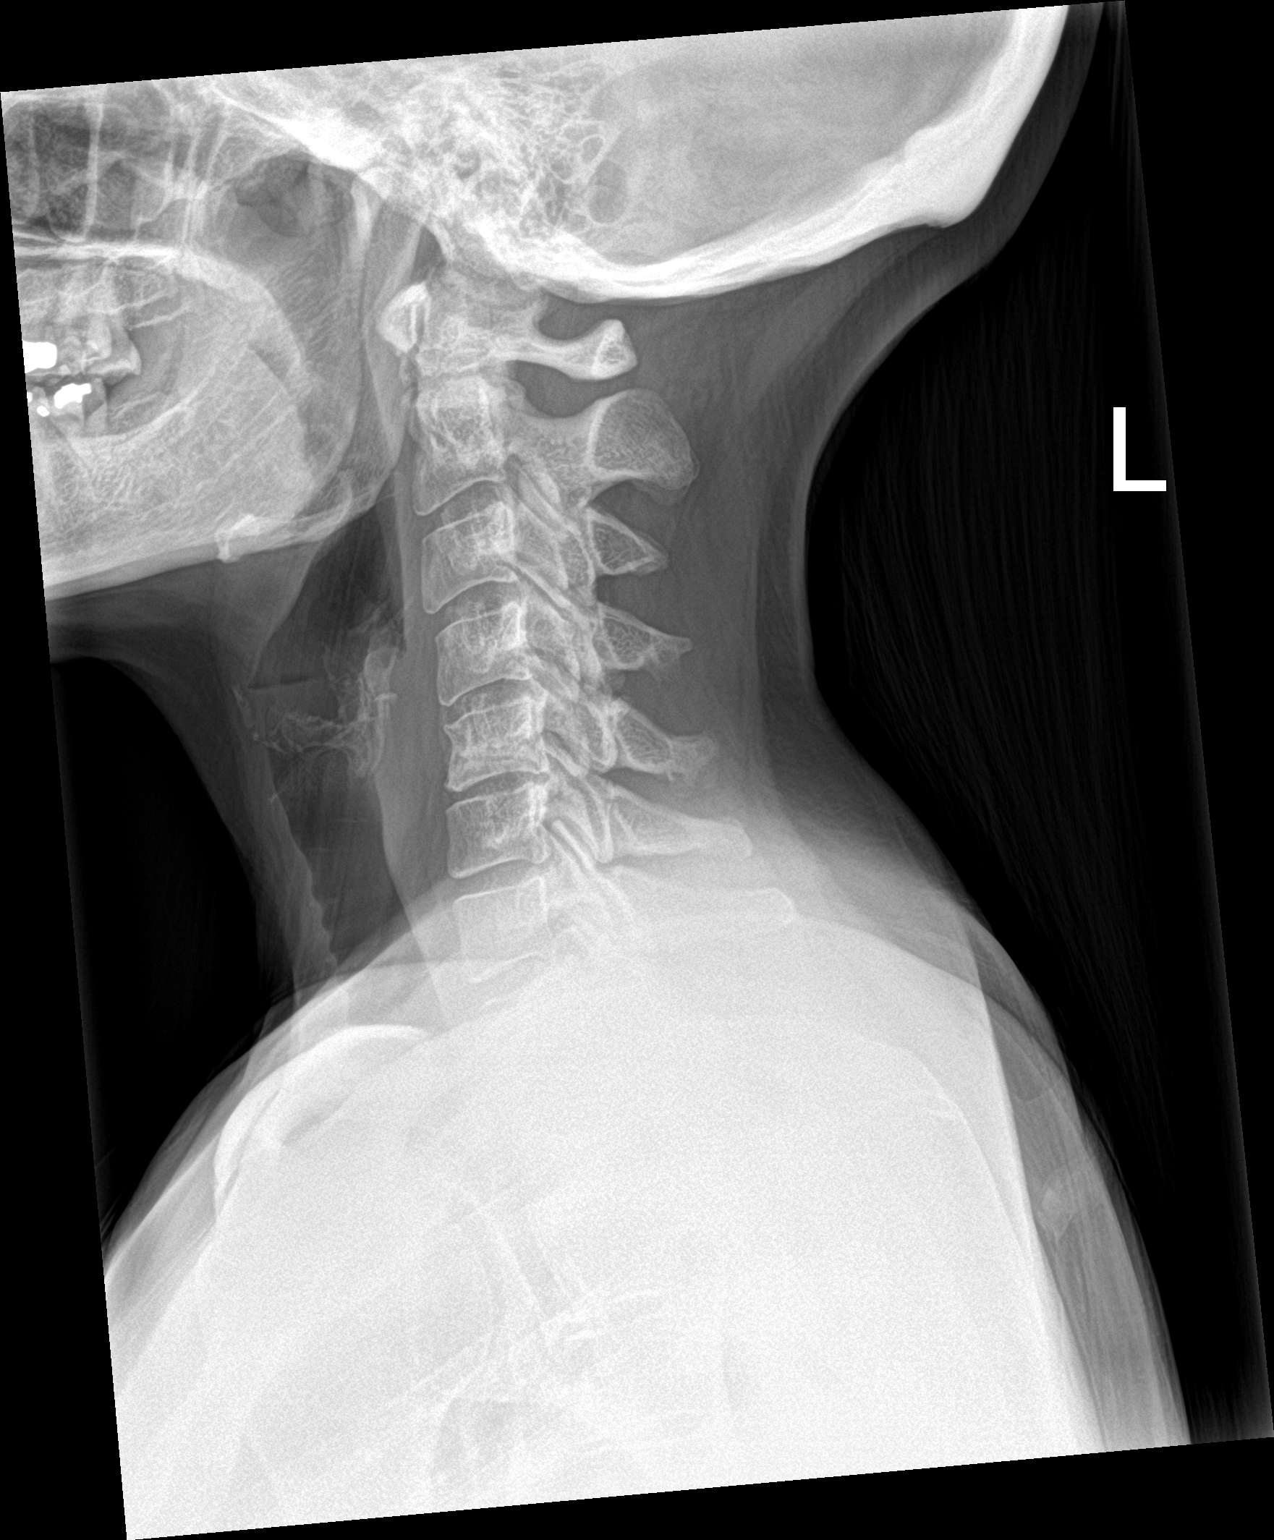

[c-spine ap]
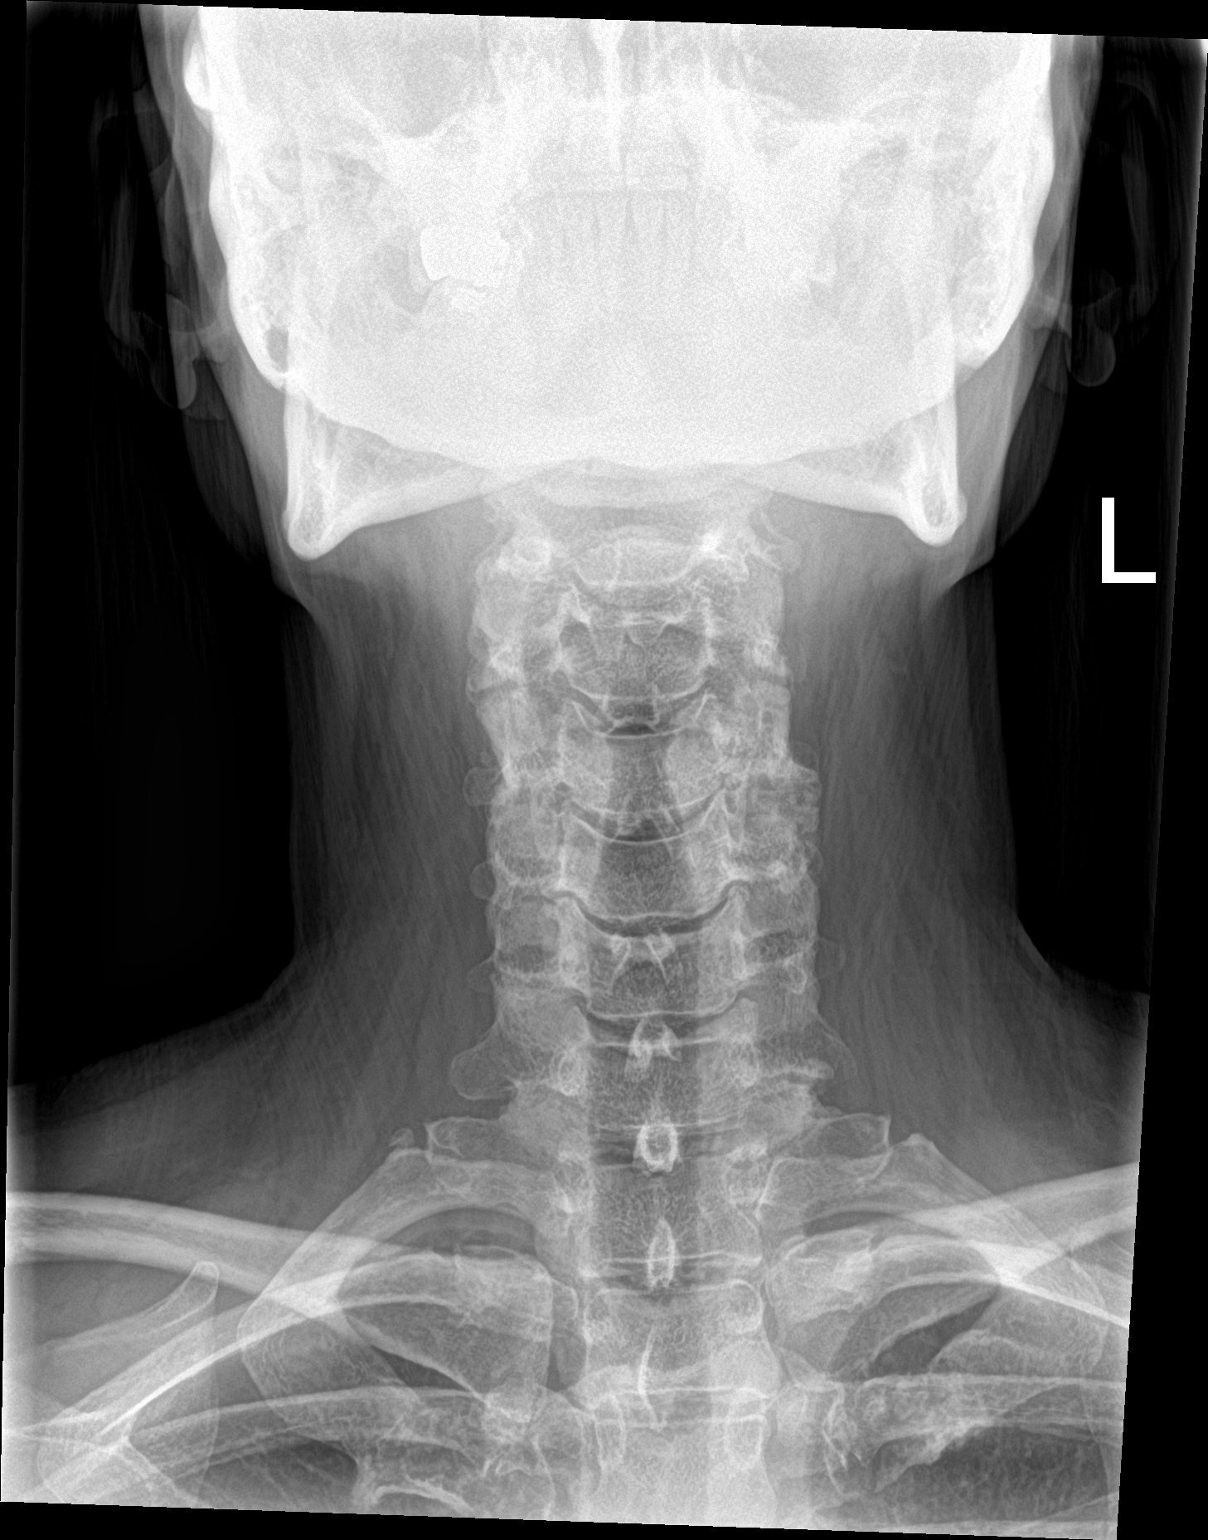

[c-spine open mouth]
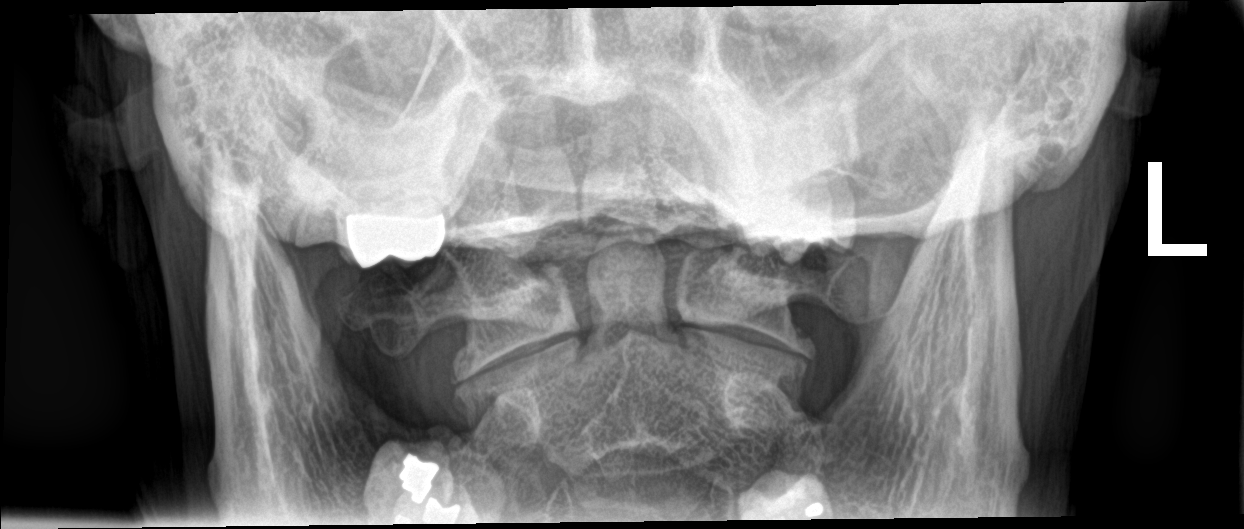

[3 of 3 positions shown; findings below may reference images not displayed]

FINDINGS: Three view study. No evidence for an acute fracture or subluxation.
Loss of disc height noted at C5-6. Neural foramina cannot be
assessed on this three view study. No prevertebral soft tissue
swelling.
IMPRESSION: Degenerative disc disease at C5-6. No acute bony findings.

## 2022-06-02 IMAGING — CR DG SHOULDER 2+V*R*
4 series · 4 of 4 positions shown · non-contrast
Comparison: None.

CLINICAL DATA: Paresthesia of right upper extremity

EXAM:
RIGHT SHOULDER - 2+ VIEW

[shoulder grashey]
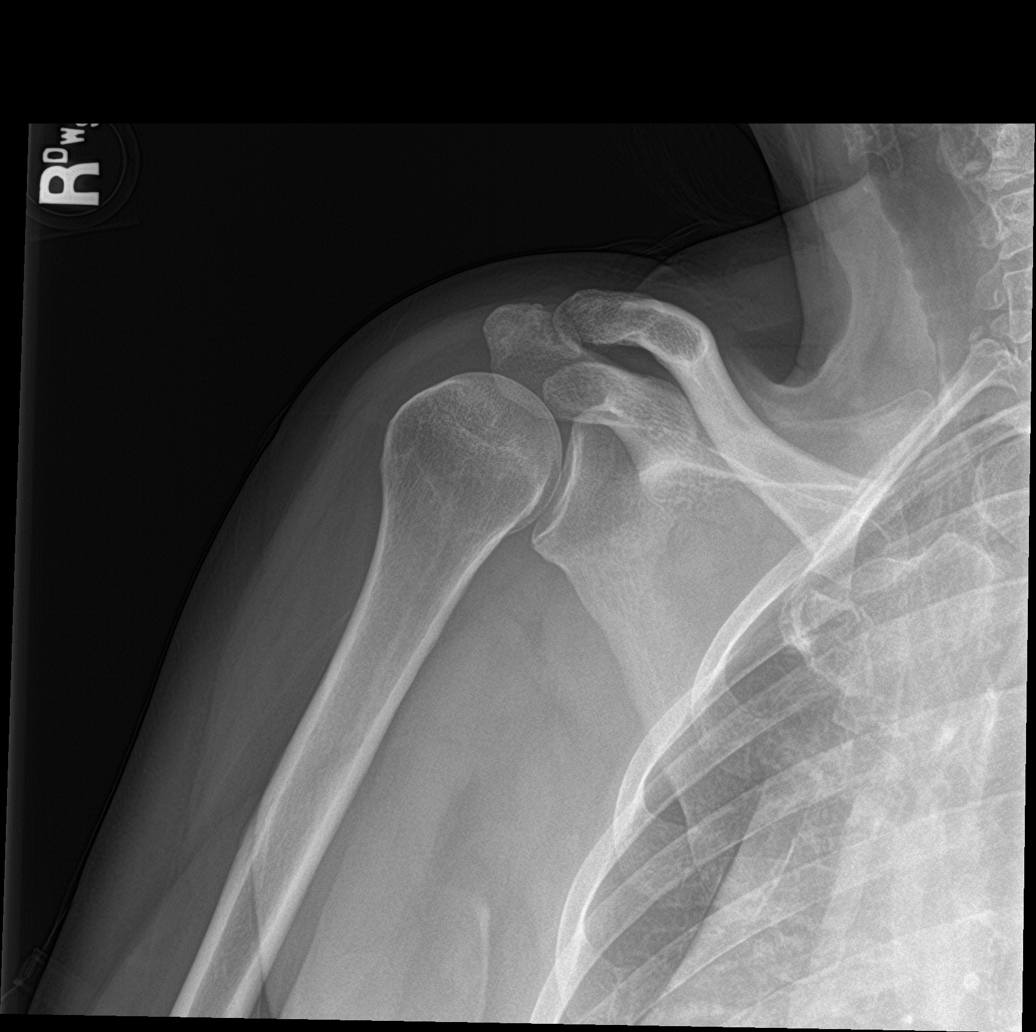

[shoulder y view]
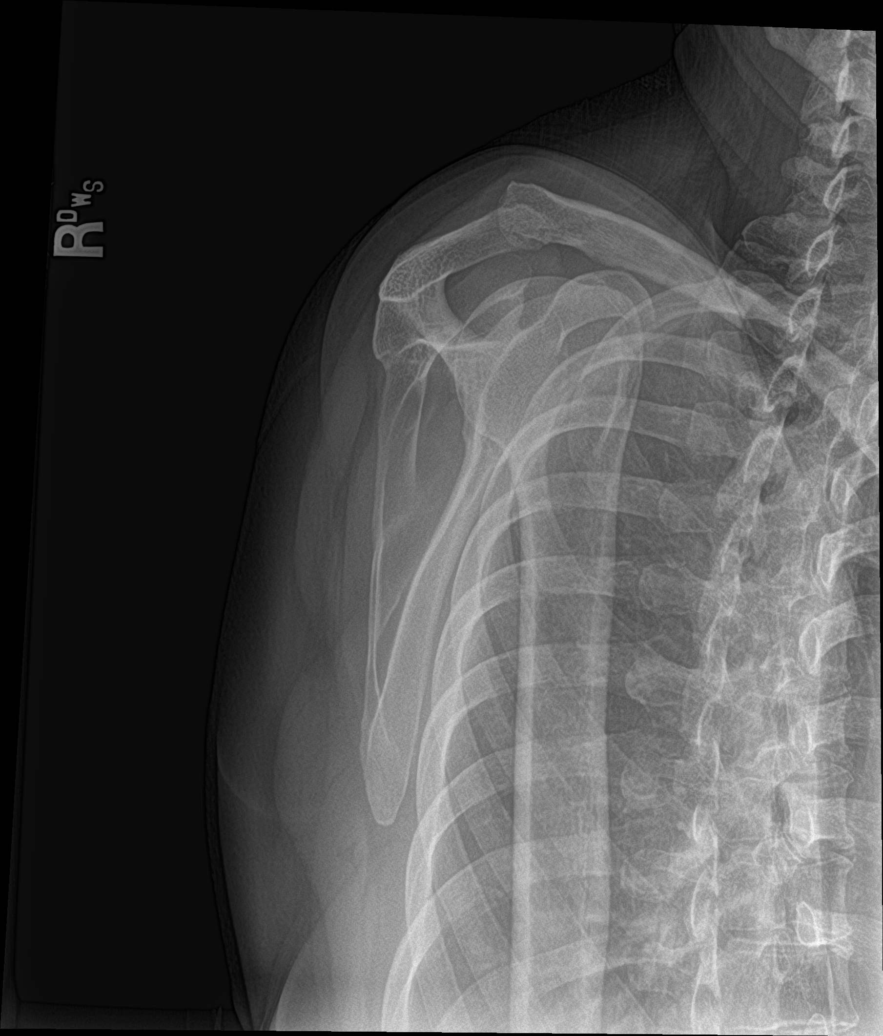

[shoulder axillary]
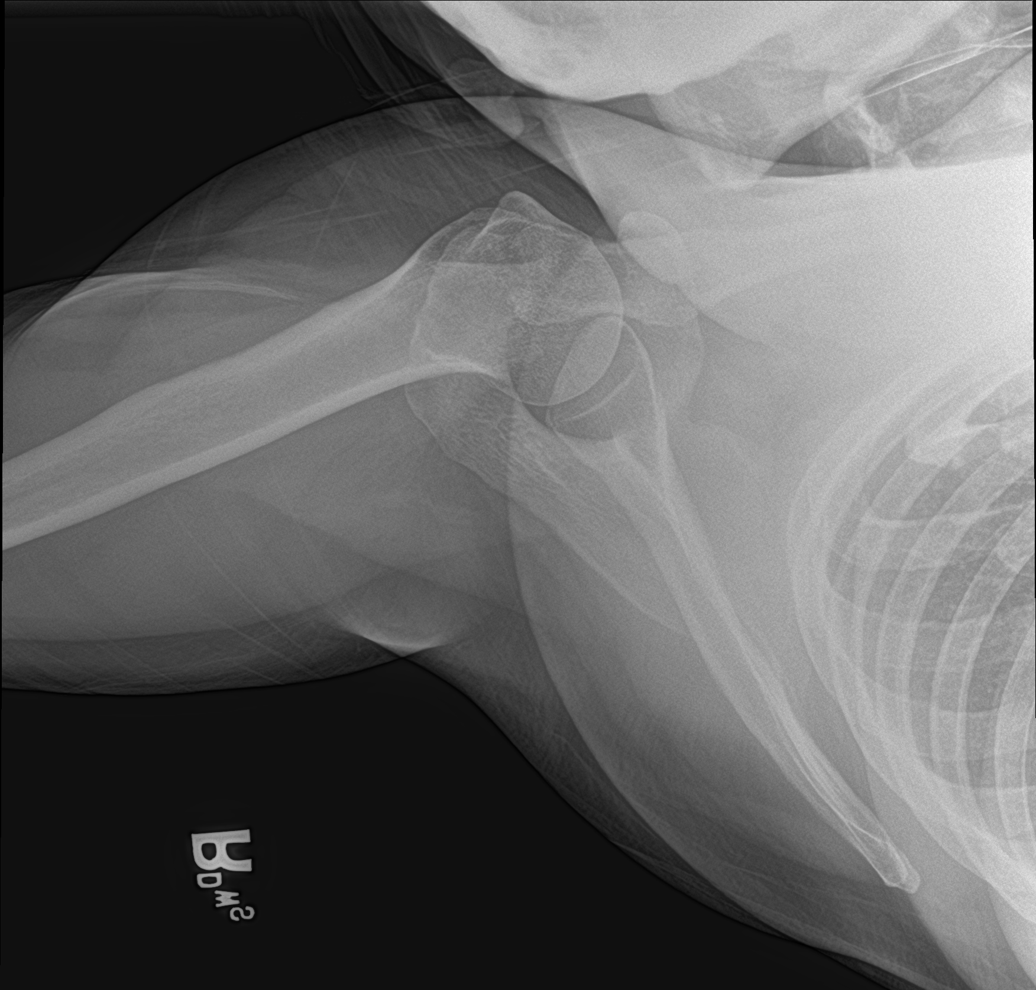

[shoulder ap neutral]
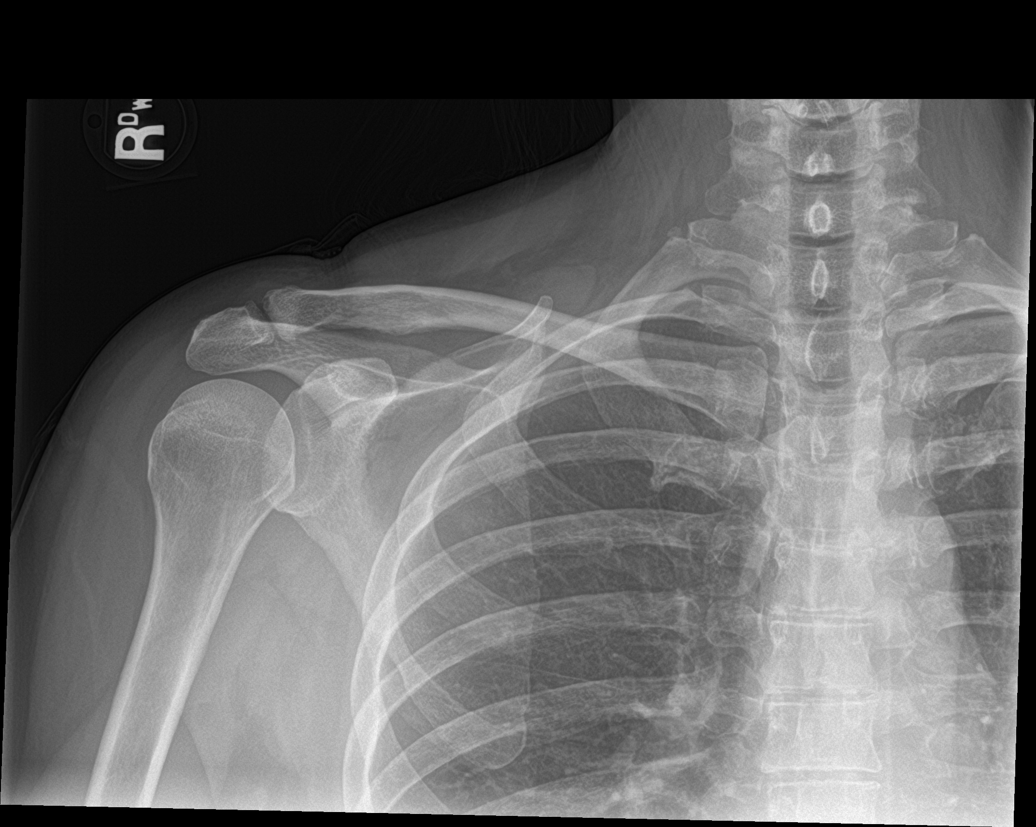

[4 of 4 positions shown; findings below may reference images not displayed]

FINDINGS: There is no evidence of fracture or dislocation. There is no
evidence of arthropathy or other focal bone abnormality. Soft
tissues are unremarkable.
IMPRESSION: Negative.

## 2022-07-23 ENCOUNTER — Other Ambulatory Visit: Payer: Self-pay | Admitting: Internal Medicine

## 2022-07-23 DIAGNOSIS — Z1231 Encounter for screening mammogram for malignant neoplasm of breast: Secondary | ICD-10-CM

## 2022-11-01 ENCOUNTER — Other Ambulatory Visit: Payer: Self-pay | Admitting: Family Medicine

## 2022-11-01 DIAGNOSIS — G959 Disease of spinal cord, unspecified: Secondary | ICD-10-CM

## 2022-11-09 ENCOUNTER — Ambulatory Visit: Payer: Managed Care, Other (non HMO) | Admitting: Neurosurgery

## 2023-01-25 ENCOUNTER — Other Ambulatory Visit: Payer: Self-pay | Admitting: Internal Medicine

## 2023-01-25 DIAGNOSIS — Z1231 Encounter for screening mammogram for malignant neoplasm of breast: Secondary | ICD-10-CM

## 2023-04-27 ENCOUNTER — Ambulatory Visit
Admission: RE | Admit: 2023-04-27 | Discharge: 2023-04-27 | Disposition: A | Discharge status: Home / Self Care | Payer: Managed Care, Other (non HMO) | Source: Ambulatory Visit | Attending: Internal Medicine | Admitting: Internal Medicine

## 2023-04-27 DIAGNOSIS — Z1231 Encounter for screening mammogram for malignant neoplasm of breast: Secondary | ICD-10-CM | POA: Diagnosis present
